# Patient Record
Sex: Male | Born: 1965 | Race: White | Hispanic: No | Marital: Married | Smoking: Former smoker
Health system: Southern US, Community
[De-identification: ages and names within clinical notes are randomized; demographics above are authoritative.]

## PROBLEM LIST (undated history)

## (undated) DIAGNOSIS — F419 Anxiety disorder, unspecified: Secondary | ICD-10-CM

## (undated) DIAGNOSIS — E785 Hyperlipidemia, unspecified: Secondary | ICD-10-CM

## (undated) DIAGNOSIS — T7840XA Allergy, unspecified, initial encounter: Secondary | ICD-10-CM

## (undated) DIAGNOSIS — I10 Essential (primary) hypertension: Secondary | ICD-10-CM

## (undated) DIAGNOSIS — M1990 Unspecified osteoarthritis, unspecified site: Secondary | ICD-10-CM

## (undated) DIAGNOSIS — J45909 Unspecified asthma, uncomplicated: Secondary | ICD-10-CM

## (undated) DIAGNOSIS — K219 Gastro-esophageal reflux disease without esophagitis: Secondary | ICD-10-CM

## (undated) HISTORY — PX: UPPER GASTROINTESTINAL ENDOSCOPY: SHX188

## (undated) HISTORY — PX: MOUTH SURGERY: SHX715

## (undated) HISTORY — PX: INGUINAL HERNIA REPAIR: SUR1180

## (undated) HISTORY — PX: OTHER SURGICAL HISTORY: SHX169

## (undated) HISTORY — DX: Gastro-esophageal reflux disease without esophagitis: K21.9

## (undated) HISTORY — DX: Unspecified asthma, uncomplicated: J45.909

## (undated) HISTORY — DX: Unspecified osteoarthritis, unspecified site: M19.90

## (undated) HISTORY — DX: Essential (primary) hypertension: I10

## (undated) HISTORY — PX: SHOULDER SURGERY: SHX246

## (undated) HISTORY — DX: Allergy, unspecified, initial encounter: T78.40XA

## (undated) HISTORY — DX: Anxiety disorder, unspecified: F41.9

## (undated) HISTORY — DX: Hyperlipidemia, unspecified: E78.5

---

## 2007-07-04 ENCOUNTER — Encounter: Admission: RE | Admit: 2007-07-04 | Discharge: 2007-07-04 | Payer: Self-pay | Admitting: Family Medicine

## 2011-01-12 ENCOUNTER — Encounter: Payer: Self-pay | Admitting: Neurological Surgery

## 2014-01-26 ENCOUNTER — Encounter: Payer: Self-pay | Admitting: Podiatry

## 2014-01-26 ENCOUNTER — Ambulatory Visit (INDEPENDENT_AMBULATORY_CARE_PROVIDER_SITE_OTHER): Payer: 59

## 2014-01-26 ENCOUNTER — Ambulatory Visit (INDEPENDENT_AMBULATORY_CARE_PROVIDER_SITE_OTHER): Payer: 59 | Admitting: Podiatry

## 2014-01-26 VITALS — BP 135/93 | HR 83 | Resp 16 | Ht 70.0 in | Wt 190.0 lb

## 2014-01-26 DIAGNOSIS — M722 Plantar fascial fibromatosis: Secondary | ICD-10-CM

## 2014-01-26 MED ORDER — TRIAMCINOLONE ACETONIDE 10 MG/ML IJ SUSP: 10.0000 mg | Freq: Once | INTRAMUSCULAR | Status: DC

## 2014-01-26 MED ORDER — DICLOFENAC SODIUM 75 MG PO TBEC: 75.0000 mg | DELAYED_RELEASE_TABLET | Freq: Two times a day (BID) | ORAL | Status: DC

## 2014-01-26 NOTE — Patient Instructions (Signed)

## 2014-01-26 NOTE — Progress Notes (Signed)
   Subjective:    Patient ID: Nathan Wu, male    DOB: Apr 21, 1966, 48 y.o.   MRN: [id number]  HPI Comments: "I have pain in the heels"  Patient states that he has been experiencing heel pain bilateral left over right since he bought new shoes 2 years ago. Has been getting worse since. AM pain. 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 Tried ace wraps, inserts, some Advil, stretching - temp relief.  Foot Pain      Review of Systems  All other systems reviewed and are negative.       Objective:   Physical Exam        Assessment & Plan:

## 2014-01-26 NOTE — Progress Notes (Signed)
Subjective:     Patient ID: Nathan Wu, male   DOB: 04-20-1966, 48 y.o.   MRN: [id number]  Foot Pain   patient states I have had heel pain in both feet for several years and it has worsened over the last 6 months. States that the pain has been worse than his left but both of them do get discomfort. Has tried over-the-counter insoles ice therapy and reduced activity at this time   Review of Systems  All other systems reviewed and are negative.       Objective:   Physical Exam  Nursing note and vitals reviewed. Constitutional: He is oriented to person, place, and time.  Cardiovascular: Intact distal pulses.   Musculoskeletal: Normal range of motion.  Neurological: He is oriented to person, place, and time.  Skin: Skin is warm.   neurovascular status intact with muscle strength adequate and no equinus condition noted. Patient is found to have pain in the plantar heel left over right with inflammation and fluid around the medial band with no other significant pathology    Assessment:     Plantar fasciitis heel region left over right chronic nature with acute flareup    Plan:     H&P and x-rays reviewed. Injected the plantar fascia of both feet 3 mg Kenalog 5 of Xylocaine Marcaine mixture and dispensed fascial brace is for each foot and placed on Voltaren 75 mg twice a day. Reappoint her recheck again in one week

## 2014-02-02 ENCOUNTER — Encounter: Payer: Self-pay | Admitting: Podiatry

## 2014-02-02 ENCOUNTER — Ambulatory Visit (INDEPENDENT_AMBULATORY_CARE_PROVIDER_SITE_OTHER): Payer: 59 | Admitting: Podiatry

## 2014-02-02 VITALS — BP 140/94 | HR 72 | Resp 12

## 2014-02-02 DIAGNOSIS — M722 Plantar fascial fibromatosis: Secondary | ICD-10-CM

## 2014-02-02 MED ORDER — TRIAMCINOLONE ACETONIDE 10 MG/ML IJ SUSP
10.0000 mg | Freq: Once | INTRAMUSCULAR | Status: AC
  Administered 2014-02-02: 10 mg

## 2014-02-02 NOTE — Progress Notes (Signed)
Subjective:     Patient ID: Nathan BreachJames Wu, male   DOB: 11-11-1966, 48 y.o.   MRN: [id number]  HPI patient states my heel is feeling some better but the left one continues to give me trouble over the right one. I am somewhat better than we were prior to working on this and I have been doing stretching exercise   Review of Systems     Objective:   Physical Exam Neurovascular status intact with patient well oriented x3 and discomfort still noted plantar heel left over right upon deep palpation    Assessment:     Plantar fasciitis improved but still present left over right    Plan:     Reviewed physical therapy and continued brace usage and reinjected the plantar fascial left 3 mg Kenalog 5 g arch Marcaine mixture and scanned for customized orthotic devices. Reappoint her recheck when return

## 2014-02-24 ENCOUNTER — Ambulatory Visit (INDEPENDENT_AMBULATORY_CARE_PROVIDER_SITE_OTHER): Payer: 59 | Admitting: *Deleted

## 2014-02-24 DIAGNOSIS — M722 Plantar fascial fibromatosis: Secondary | ICD-10-CM

## 2014-02-24 NOTE — Progress Notes (Signed)
   Subjective:    Patient ID: Nathan BreachJames Marken, male    DOB: 18-Nov-1966, 48 y.o.   MRN: [id number]  HPI  DISPENSED ORTHOTICS AND GIVEN INSTRUCTION.    Review of Systems     Objective:   Physical Exam        Assessment & Plan:

## 2014-02-24 NOTE — Patient Instructions (Signed)

## 2014-03-27 ENCOUNTER — Ambulatory Visit: Payer: 59 | Admitting: Podiatry

## 2014-04-05 ENCOUNTER — Ambulatory Visit: Payer: 59 | Admitting: Podiatry

## 2015-03-13 ENCOUNTER — Encounter: Payer: Self-pay | Admitting: Podiatry

## 2015-03-13 ENCOUNTER — Ambulatory Visit (INDEPENDENT_AMBULATORY_CARE_PROVIDER_SITE_OTHER): Payer: 59 | Admitting: Podiatry

## 2015-03-13 ENCOUNTER — Ambulatory Visit (INDEPENDENT_AMBULATORY_CARE_PROVIDER_SITE_OTHER): Payer: 59

## 2015-03-13 VITALS — BP 132/85 | HR 89 | Resp 18

## 2015-03-13 DIAGNOSIS — M779 Enthesopathy, unspecified: Secondary | ICD-10-CM

## 2015-03-13 DIAGNOSIS — M79672 Pain in left foot: Secondary | ICD-10-CM

## 2015-03-13 DIAGNOSIS — M722 Plantar fascial fibromatosis: Secondary | ICD-10-CM

## 2015-03-13 MED ORDER — TRIAMCINOLONE ACETONIDE 10 MG/ML IJ SUSP
10.0000 mg | Freq: Once | INTRAMUSCULAR | Status: AC
  Administered 2015-03-13: 10 mg

## 2015-03-14 NOTE — Progress Notes (Signed)
Subjective:     Patient ID: Nathan Wu, male   DOB: 1966-11-19, 49 y.o.   MRN: [id number]  HPI patient presents with pain near his ankle left and states that he also still has his heel pain but it is some improved from prior.   Review of Systems     Objective:   Physical Exam Neurovascular status intact muscle strength adequate with exquisite inflammation in the sinus tarsi left upon palpation    Assessment:     Sinus tarsitis left with probable change in gait process    Plan:     Injected the sinus tarsi left 3 mg Kenalog 5 mg Xylocaine and instructed on physical therapy and reappoint to recheck

## 2020-03-01 ENCOUNTER — Ambulatory Visit: Payer: 59 | Attending: Internal Medicine

## 2020-03-01 DIAGNOSIS — Z23 Encounter for immunization: Secondary | ICD-10-CM

## 2020-03-01 NOTE — Progress Notes (Signed)
   Covid-19 Vaccination Clinic  Name:  Nathan Wu    MRN: [id number] DOB: 04-02-1966  03/01/2020  Nathan Wu was observed post Covid-19 immunization for 15 minutes without incident. He was provided with Vaccine Information Sheet and instruction to access the V-Safe system.   Nathan Wu was instructed to call 911 with any severe reactions post vaccine: Marland Kitchen Difficulty breathing  . Swelling of face and throat  . A fast heartbeat  . A bad rash all over body  . Dizziness and weakness   Immunizations Administered    Name Date Dose VIS Date Route   Pfizer COVID-19 Vaccine 03/01/2020  2:34 PM 0.3 mL 12/02/2019 Intramuscular   Manufacturer: [employer], [employer]   Lot: [id number]   NDC: [address]-0

## 2020-03-27 ENCOUNTER — Ambulatory Visit: Payer: 59 | Attending: Internal Medicine

## 2020-03-27 DIAGNOSIS — Z23 Encounter for immunization: Secondary | ICD-10-CM

## 2020-03-27 NOTE — Progress Notes (Signed)
   Covid-19 Vaccination Clinic  Name:  Nathan Wu    MRN: [id number] DOB: 1966-04-14  03/27/2020  [name] was observed post Covid-19 immunization for 15 minutes without incident. He was provided with Vaccine Information Sheet and instruction to access the V-Safe system.   [name] was instructed to call 911 with any severe reactions post vaccine: Marland Kitchen Difficulty breathing  . Swelling of face and throat  . A fast heartbeat  . A bad rash all over body  . Dizziness and weakness   Immunizations Administered    Name Date Dose VIS Date Route   Pfizer COVID-19 Vaccine 03/27/2020  3:56 PM 0.3 mL 12/02/2019 Intramuscular   Manufacturer: [employer], [employer]   Lot: [id number]   NDC: [address]-7

## 2021-04-04 ENCOUNTER — Telehealth: Payer: Self-pay | Admitting: Internal Medicine

## 2021-04-04 NOTE — Telephone Encounter (Signed)
See late next week

## 2021-04-04 NOTE — Telephone Encounter (Signed)
scheduled

## 2021-04-04 NOTE — Telephone Encounter (Signed)
When I called patient to schedule a New Patient appointment he told me that he has been swollen behind his left knee for couple of weeks, he is now limping, he would like to be seen for this before the NP/CPE appointment that I scheduled in June. He is leaving in a couple hours and will not be back until Tuesday. I ask if if was discolored and he said it looked like maybe a blood vessel or something.

## 2021-04-11 ENCOUNTER — Other Ambulatory Visit: Payer: Self-pay

## 2021-04-11 ENCOUNTER — Ambulatory Visit (INDEPENDENT_AMBULATORY_CARE_PROVIDER_SITE_OTHER): Payer: 59 | Admitting: Internal Medicine

## 2021-04-11 ENCOUNTER — Encounter: Payer: Self-pay | Admitting: Internal Medicine

## 2021-04-11 VITALS — BP 130/100 | HR 90 | Ht 70.0 in | Wt 199.0 lb

## 2021-04-11 DIAGNOSIS — I10 Essential (primary) hypertension: Secondary | ICD-10-CM

## 2021-04-11 DIAGNOSIS — I8392 Asymptomatic varicose veins of left lower extremity: Secondary | ICD-10-CM

## 2021-04-11 DIAGNOSIS — M7122 Synovial cyst of popliteal space [Baker], left knee: Secondary | ICD-10-CM | POA: Diagnosis not present

## 2021-04-11 DIAGNOSIS — J3089 Other allergic rhinitis: Secondary | ICD-10-CM | POA: Diagnosis not present

## 2021-04-11 MED ORDER — LISINOPRIL 10 MG PO TABS: 10.0000 mg | ORAL_TABLET | Freq: Every day | ORAL | 0 refills | Status: DC

## 2021-04-11 MED ORDER — METHYLPREDNISOLONE ACETATE 80 MG/ML IJ SUSP
80.0000 mg | Freq: Once | INTRAMUSCULAR | Status: AC
  Administered 2021-04-11: 80 mg via INTRAMUSCULAR

## 2021-04-11 NOTE — Progress Notes (Signed)
   Subjective:    Patient ID: Nathan Wu, male    DOB: [birth year], 55 y.o.   MRN: [id number]  HPI 55 year old Male here for the first time today. Has been having issues with elevated BP.  He is concerned about this and did not want to wait until his health maintenance exam to have this evaluated.  Also, he is concerned about some swelling in the popliteal area.  He has health maintenance exam upcoming in the near future around June 21.  Took Lisinopril 5 or 10 mg daily for hypertension several years.  FHx: Father with hx of HTN, carotid endarertectomy. Mother deceased with hx of melanoma- started in eye and metastasized.  He has some chronic postnasal drip and likely would benefit from Flonase nasal spray.  Review of Systems to be reviewed at time of health maintenance exam in June     Objective:   Physical Exam Blood pressure is elevated at 130/100 BMI 28.55 pulse 90 and regular pulse oximetry 97% weight 199 pounds height 5 feet 10 inches  Skin is warm and dry.  No cervical adenopathy.  Chest is clear to auscultation.  Cardiac exam regular rate and rhythm.  Has boggy nasal mucosa consistent with allergic rhinitis.  Neck is supple.  No carotid bruits.  No JVD.  He has a popliteal swelling behind left knee which is consistent with a benign popliteal cyst.       Assessment & Plan:  History of essential hypertension and likely continues to have this although he currently is not on medication.  We are going to restart lisinopril 10 mg daily and follow-up with him in June at time of health maintenance exam.  He may try Flonase nasal spray for nasal congestion and postnasal drip.  Benign left popliteal cyst -reassured there was no clot there

## 2021-05-19 NOTE — Patient Instructions (Signed)
Keep appointment June 21 for health maintenance exam.  We have started you on lisinopril 10 mg daily for elevated blood pressure.  May try Flonase nasal spray for nasal congestion and postnasal drip.  You have a likely benign popliteal cyst left posterior leg near the knee which is not a clot.

## 2021-05-21 ENCOUNTER — Telehealth: Payer: Self-pay

## 2021-05-21 NOTE — Telephone Encounter (Signed)
We need more information

## 2021-05-21 NOTE — Telephone Encounter (Signed)
Called patient back to let him know since he was at [facility] he would need to go to Urgent Care to be treated, he verbalized understanding

## 2021-05-21 NOTE — Telephone Encounter (Addendum)
Called patient back he is at [facility] was going to head home this coming weekend, however since he has COVID, he is not well enough to travel. Has had COVID vaccine and 1 booster. He is coughing up a little clear stuff, however unable to rest from coughing so much and his chest is hurting from the coughing.

## 2021-05-21 NOTE — Telephone Encounter (Signed)
Advise Urgent Care visit there.

## 2021-05-21 NOTE — Telephone Encounter (Signed)
Patient called symptoms began 3 days ago, test for COVID today was positive. Has a fever 100.6, cough, runny nose. No appetite and is fatigued. He is out of town and would like to get some advise. He has a PCR test scheduled tomorrow at 11:45am.   [phone]

## 2021-06-06 ENCOUNTER — Other Ambulatory Visit (INDEPENDENT_AMBULATORY_CARE_PROVIDER_SITE_OTHER): Payer: 59 | Admitting: Internal Medicine

## 2021-06-06 ENCOUNTER — Other Ambulatory Visit: Payer: Self-pay

## 2021-06-06 DIAGNOSIS — Z0000 Encounter for general adult medical examination without abnormal findings: Secondary | ICD-10-CM

## 2021-06-06 DIAGNOSIS — Z125 Encounter for screening for malignant neoplasm of prostate: Secondary | ICD-10-CM

## 2021-06-06 DIAGNOSIS — I10 Essential (primary) hypertension: Secondary | ICD-10-CM

## 2021-06-06 DIAGNOSIS — Z13220 Encounter for screening for lipoid disorders: Secondary | ICD-10-CM

## 2021-06-06 LAB — CBC WITH DIFFERENTIAL/PLATELET
Absolute Monocytes: 328 cells/uL (ref 200–950)
Basophils Absolute: 20 cells/uL (ref 0–200)
Basophils Relative: 0.4 %
Eosinophils Absolute: 167 cells/uL (ref 15–500)
Eosinophils Relative: 3.4 %
HCT: 42.4 % (ref 38.5–50.0)
Hemoglobin: 14 g/dL (ref 13.2–17.1)
Lymphs Abs: 1313 cells/uL (ref 850–3900)
MCH: 27.6 pg (ref 27.0–33.0)
MCHC: 33 g/dL (ref 32.0–36.0)
MCV: 83.6 fL (ref 80.0–100.0)
MPV: 11.5 fL (ref 7.5–12.5)
Monocytes Relative: 6.7 %
Neutro Abs: 3072 cells/uL (ref 1500–7800)
Neutrophils Relative %: 62.7 %
Platelets: 209 10*3/uL (ref 140–400)
RBC: 5.07 10*6/uL (ref 4.20–5.80)
RDW: 13 % (ref 11.0–15.0)
Total Lymphocyte: 26.8 %
WBC: 4.9 10*3/uL (ref 3.8–10.8)

## 2021-06-06 LAB — COMPLETE METABOLIC PANEL WITHOUT GFR
AG Ratio: 1.8 (calc) (ref 1.0–2.5)
ALT: 28 U/L (ref 9–46)
AST: 20 U/L (ref 10–35)
Albumin: 4.5 g/dL (ref 3.6–5.1)
Alkaline phosphatase (APISO): 61 U/L (ref 35–144)
BUN: 10 mg/dL (ref 7–25)
CO2: 28 mmol/L (ref 20–32)
Calcium: 9.4 mg/dL (ref 8.6–10.3)
Chloride: 101 mmol/L (ref 98–110)
Creat: 0.89 mg/dL (ref 0.70–1.33)
GFR, Est African American: 112 mL/min/{1.73_m2}
GFR, Est Non African American: 97 mL/min/{1.73_m2}
Globulin: 2.5 g/dL (ref 1.9–3.7)
Glucose, Bld: 88 mg/dL (ref 65–99)
Potassium: 4.2 mmol/L (ref 3.5–5.3)
Sodium: 136 mmol/L (ref 135–146)
Total Bilirubin: 0.6 mg/dL (ref 0.2–1.2)
Total Protein: 7 g/dL (ref 6.1–8.1)

## 2021-06-06 LAB — LIPID PANEL
Cholesterol: 247 mg/dL — ABNORMAL HIGH
HDL: 49 mg/dL
LDL Cholesterol (Calc): 159 mg/dL — ABNORMAL HIGH
Non-HDL Cholesterol (Calc): 198 mg/dL — ABNORMAL HIGH
Total CHOL/HDL Ratio: 5 (calc) — ABNORMAL HIGH
Triglycerides: 230 mg/dL — ABNORMAL HIGH

## 2021-06-06 LAB — PSA: PSA: 0.68 ng/mL (ref <=4.00)

## 2021-06-11 ENCOUNTER — Encounter: Payer: Self-pay | Admitting: Internal Medicine

## 2021-06-11 ENCOUNTER — Other Ambulatory Visit: Payer: Self-pay

## 2021-06-11 ENCOUNTER — Ambulatory Visit (INDEPENDENT_AMBULATORY_CARE_PROVIDER_SITE_OTHER): Payer: 59 | Admitting: Internal Medicine

## 2021-06-11 VITALS — BP 120/90 | Ht 70.0 in | Wt 187.0 lb

## 2021-06-11 DIAGNOSIS — I10 Essential (primary) hypertension: Secondary | ICD-10-CM | POA: Diagnosis not present

## 2021-06-11 DIAGNOSIS — E782 Mixed hyperlipidemia: Secondary | ICD-10-CM | POA: Diagnosis not present

## 2021-06-11 DIAGNOSIS — Z8659 Personal history of other mental and behavioral disorders: Secondary | ICD-10-CM | POA: Diagnosis not present

## 2021-06-11 DIAGNOSIS — J3089 Other allergic rhinitis: Secondary | ICD-10-CM

## 2021-06-11 DIAGNOSIS — Z8616 Personal history of COVID-19: Secondary | ICD-10-CM

## 2021-06-11 DIAGNOSIS — Z0000 Encounter for general adult medical examination without abnormal findings: Secondary | ICD-10-CM | POA: Diagnosis not present

## 2021-06-11 LAB — POCT URINALYSIS DIPSTICK
Appearance: NEGATIVE
Bilirubin, UA: NEGATIVE
Blood, UA: NEGATIVE
Glucose, UA: NEGATIVE
Ketones, UA: NEGATIVE
Leukocytes, UA: NEGATIVE
Nitrite, UA: NEGATIVE
Odor: NEGATIVE
Protein, UA: NEGATIVE
Spec Grav, UA: 1.015 (ref 1.010–1.025)
Urobilinogen, UA: 0.2 E.U./dL
pH, UA: 6.5 (ref 5.0–8.0)

## 2021-06-11 NOTE — Patient Instructions (Addendum)
Begin Crestor 20 mg daily and will follow up in 3 months. Colonoscopy referral.  It was a pleasure to see you today.

## 2021-06-11 NOTE — Progress Notes (Addendum)
   Subjective:    Patient ID: Nathan Wu, male    DOB: 11/26/1966, 55 y.o.   MRN: [id number]  HPI 55 year old Male presents for health maintenance exam.  He was seen initially for the first time April 21 regarding elevated blood pressure.  Previously had taken lisinopril for high blood pressure.  At last visit started on lisinopril 10 mg daily and tolerates it well.  We received a telephone call from him on May 31 indicating that he had COVID-19.  He was at [facility] and apparently became ill there.  Urgent care visit advised.  Past medical history: Fractured right wrist around 1977  History of hernia repair around 1975  History of shoulder surgery 2015  History of vitreous floater bilaterally seen ophthalmologist Dr. Sondra Barges at [facility] ophthalmology in [facility].  History of plantar fasciitis and has seen podiatrist in the past  Had Tdap in 2020.  Has had 3 COVID vaccines.  The last one was November 2021.  Had lentigo removed from right neck and left rib in 2005  History of chronic postnasal drip and at last visit which was his first visit April 11, 2021 Flonase nasal spray was recommended.  Depo-Medrol 80 mg IM given in April for allergy symptoms.  History of plantar fasciitis seen by Dr. Charlsie Merles in 2015.  Family history: Father with history of hypertension and carotid endarterectomy.  Mother deceased with history of melanoma-disease started in her and metastasized.  3 brothers 1 died at age 72 of undetermined causes.  1 brother age 65 in good health.  1 brother age 55 with hypertension.  Social history: He is married.  He works as an Lexicographer.  He is employed by [employer] Former smoker.  Social alcohol consumption consisting of beer and mixed drinks.  No children.  Wife is retired.  He enjoys golf and fishing.  Quit smoking 15 years ago.  Referred by Donald Pore.         Review of Systems generally feels well and has  no new complaints.   No concerns after having COVID-19.     Objective:   Physical Exam Blood pressure 120/90 weight 187 pounds height 5 feet 10 inches  Skin: Warm and dry.  No cervical adenopathy.  No thyromegaly.  No carotid bruits.  Chest is clear. Cardiac exam: Regular rate and rhythm without ectopy.  Abdomen is soft nondistended, without organomegaly or masses. Prostate normal. No LE edema. Neuro: No focal deficits. Affect, thought, and judgement normal.      Assessment & Plan:   Essential hypertension currently stable and will continue to monitor on Lisinopril 10 mg daily  Needs referral for colonoscopy  Significant hyperlipidemia - mixed. Start Crestor 20 mg daily and RTC in 3 months for follow up.  Hx of Covid-19  History of anxiety- Rx for Xanax to take sparingly if needed.  Plan: Continue Lisinopril and start Crestor. Follow up in 3 months. Covid booster suggested in 2 months. Tetanus vaccine is up to date.

## 2021-06-12 MED ORDER — ROSUVASTATIN CALCIUM 20 MG PO TABS: 20.0000 mg | ORAL_TABLET | Freq: Every day | ORAL | 3 refills | Status: DC

## 2021-06-12 MED ORDER — LISINOPRIL 10 MG PO TABS: 10.0000 mg | ORAL_TABLET | Freq: Every day | ORAL | 1 refills | Status: DC

## 2021-06-12 MED ORDER — ALPRAZOLAM 0.5 MG PO TABS: 0.5000 mg | ORAL_TABLET | Freq: Two times a day (BID) | ORAL | 0 refills | Status: DC | PRN

## 2021-07-18 ENCOUNTER — Ambulatory Visit (INDEPENDENT_AMBULATORY_CARE_PROVIDER_SITE_OTHER): Payer: 59 | Admitting: Internal Medicine

## 2021-07-18 ENCOUNTER — Other Ambulatory Visit: Payer: Self-pay

## 2021-07-18 ENCOUNTER — Encounter: Payer: Self-pay | Admitting: Internal Medicine

## 2021-07-18 VITALS — BP 140/90 | HR 102 | Temp 98.0°F | Ht 70.0 in | Wt 187.0 lb

## 2021-07-18 DIAGNOSIS — E782 Mixed hyperlipidemia: Secondary | ICD-10-CM

## 2021-07-18 DIAGNOSIS — I10 Essential (primary) hypertension: Secondary | ICD-10-CM | POA: Diagnosis not present

## 2021-07-18 DIAGNOSIS — J22 Unspecified acute lower respiratory infection: Secondary | ICD-10-CM

## 2021-07-18 MED ORDER — AZITHROMYCIN 250 MG PO TABS: ORAL_TABLET | ORAL | 0 refills | Status: AC

## 2021-07-18 MED ORDER — METHYLPREDNISOLONE ACETATE 80 MG/ML IJ SUSP
80.0000 mg | Freq: Once | INTRAMUSCULAR | Status: AC
  Administered 2021-07-18: 80 mg via INTRAMUSCULAR

## 2021-07-18 MED ORDER — HYDROCODONE BIT-HOMATROP MBR 5-1.5 MG/5ML PO SOLN: 5.0000 mL | Freq: Three times a day (TID) | ORAL | 0 refills | Status: DC | PRN

## 2021-07-18 NOTE — Progress Notes (Addendum)
   Subjective:    Patient ID: Nathan Wu, male    DOB: 04-May-1966, 55 y.o.   MRN: [id number]  HPI 55 year old Male with 2-3  week history of cough that started around the time he had a COVID exposure at work.  He is already had COVID-19 once around May 31.  Records indicate he has had 3 COVID immunizations the last 22 October 2020.  He has not been boosted.  It was after having COVID for him to get his booster this summer.  He denies shortness breath and he is tired of coughing.  Cough is productive.   He had health maintenance exam and established here in June 2022.  He was started on Crestor 20 mg daily at that time.  He has a history of hypertension.    Review of Systems no dysgeusia, shortness of breath, myalgias, documented fever or chills     Objective:   Physical Exam Blood pressure 140/90, pulse 102, he seems slightly anxious temperature 98 degrees pulse oximetry 97% weight 187 pounds BMI 26.83  Skin: Warm and dry.  No cervical adenopathy.  TMs are clear.  Neck is supple.  Chest is clear to auscultation but he has a deep congested cough       Assessment & Plan:   Acute lower respiratory infection  Plan: Depo-Medrol 80 mg IM which should help with inflammation and cough.  Zithromax Z-PAK 2 tabs day 1 followed by 1 tab days 2 through 5.  He will take Hycodan 1 teaspoon every 8 hours as needed for cough.  Rest and drink plenty of fluids.  I have obtained respiratory virus panel and COVID-19 PCR test today.  Addendum Covid-19 PCR test is positive. Patient informed. Out of work x 5 days from yesterday.

## 2021-07-18 NOTE — Patient Instructions (Addendum)
Rest and drink plenty of fluids.  COVID-19 PCR respiratory virus panel pending.  Take Zithromax Z-PAK 2 tabs day 1 followed by 1 tab days 2 through 5.  Hycodan sparingly 1 teaspoon every 8 hours as needed for cough.  Call if not improving in 7 to 10 days or sooner if worse.  Depo-Medrol 80 mg IM given in office

## 2021-07-19 LAB — SARS-COV-2 RNA,(COVID-19) QUALITATIVE NAAT: SARS CoV2 RNA: DETECTED — AB

## 2021-07-22 ENCOUNTER — Telehealth: Payer: Self-pay | Admitting: Internal Medicine

## 2021-07-22 NOTE — Telephone Encounter (Signed)
Faxed Positive PCR COVID results to [facility] [phone], phone [phone]  Positive as of 07/18/2021

## 2021-09-09 ENCOUNTER — Other Ambulatory Visit (INDEPENDENT_AMBULATORY_CARE_PROVIDER_SITE_OTHER): Payer: 59 | Admitting: Internal Medicine

## 2021-09-09 ENCOUNTER — Other Ambulatory Visit: Payer: Self-pay

## 2021-09-09 DIAGNOSIS — Z5181 Encounter for therapeutic drug level monitoring: Secondary | ICD-10-CM

## 2021-09-09 DIAGNOSIS — I10 Essential (primary) hypertension: Secondary | ICD-10-CM

## 2021-09-10 LAB — HEPATIC FUNCTION PANEL
AG Ratio: 2 (calc) (ref 1.0–2.5)
ALT: 36 U/L (ref 9–46)
AST: 28 U/L (ref 10–35)
Albumin: 4.8 g/dL (ref 3.6–5.1)
Alkaline phosphatase (APISO): 68 U/L (ref 35–144)
Bilirubin, Direct: 0.1 mg/dL (ref 0.0–0.2)
Globulin: 2.4 g/dL (calc) (ref 1.9–3.7)
Indirect Bilirubin: 0.5 mg/dL (calc) (ref 0.2–1.2)
Total Bilirubin: 0.6 mg/dL (ref 0.2–1.2)
Total Protein: 7.2 g/dL (ref 6.1–8.1)

## 2021-09-10 LAB — LIPID PANEL
Cholesterol: 170 mg/dL (ref <200)
HDL: 68 mg/dL (ref >=40)
LDL Cholesterol (Calc): 78 mg/dL (calc)
Non-HDL Cholesterol (Calc): 102 mg/dL (calc) (ref <130)
Total CHOL/HDL Ratio: 2.5 (calc) (ref <5.0)
Triglycerides: 137 mg/dL (ref <150)

## 2021-09-12 ENCOUNTER — Encounter: Payer: Self-pay | Admitting: Internal Medicine

## 2021-09-12 ENCOUNTER — Ambulatory Visit (INDEPENDENT_AMBULATORY_CARE_PROVIDER_SITE_OTHER): Payer: 59 | Admitting: Internal Medicine

## 2021-09-12 ENCOUNTER — Other Ambulatory Visit: Payer: Self-pay

## 2021-09-12 VITALS — BP 120/90 | HR 60 | Resp 18 | Ht 70.0 in | Wt 196.0 lb

## 2021-09-12 DIAGNOSIS — F5102 Adjustment insomnia: Secondary | ICD-10-CM | POA: Diagnosis not present

## 2021-09-12 DIAGNOSIS — Z8659 Personal history of other mental and behavioral disorders: Secondary | ICD-10-CM | POA: Diagnosis not present

## 2021-09-12 DIAGNOSIS — E782 Mixed hyperlipidemia: Secondary | ICD-10-CM | POA: Diagnosis not present

## 2021-09-12 MED ORDER — ALPRAZOLAM 0.5 MG PO TABS: 0.5000 mg | ORAL_TABLET | Freq: Two times a day (BID) | ORAL | 2 refills | Status: DC | PRN

## 2021-09-12 NOTE — Progress Notes (Signed)
   Subjective:    Patient ID: Nathan Wu, male    DOB: Jun 26, 1966, 55 y.o.   MRN: [id number]  HPI 55 year old Male seen today for follow up on hyperlipidemia. Has been taking generic Crestor 20 mg daily since June.  In June, total cholesterol was 247 and triglycerides were 230.  LDL was 159.  Now on Crestor 20 mg daily his lipid panel is completely normal.  LDL is 78, triglycerides 137, HDL 68 increased from 49 when checked 3 months ago and total cholesterol has improved from  247 to 170. This is and excellent response.  Not a lot of exercise.  A lot of stress at work.  Longstanding history of insomnia.  Says his father has insomnia and he thinks it may be inherited.  However there is work stress.  Asking about sleep medication.  Sometimes during the day when there is work stress he will take Xanax 0.5 mg on an as-needed basis.  I suggested he try Xanax 0.5 mg 1 hour before going to bed.  When he gets in bed his mind gets going and cannot fall asleep.  This may have an element of anxiety and work stress is contributing to that.  His wife has retired.  He is not ready to retire yet.  Review of Systems  tried Effexor for sleep and work stress but had issues with elevated BP and he stopped taking it.     Objective:   Physical Exam Blood pressure today is 120/90 pulse 60 regular respiratory rate 18 pulse oximetry 98% weight 196 pounds BMI 28.12  His affect is slightly anxious when speaking about his sleep issues.     Assessment & Plan:  I think his insomnia has an element of anxiety and work stress is contributing to that.  He may try Xanax 0.5 mg up to twice daily as needed for insomnia and anxiety.  Hyperlipidemia-markedly improved and now normal lipid panel with Crestor 20 mg daily.  He will continue with same dose.  Health maintenance: Recommend COVID booster.  Needs to have flu vaccine.

## 2021-09-12 NOTE — Patient Instructions (Addendum)
Try Xanax 0.5 mg up to twice daily if needed for insomnia/anxiety.  Lipid panel is excellent on Crestor 20 mg daily.  Follow-up in June for health maintenance exam and fasting labs.  Please have COVID booster this Fall.  Recommend flu vaccine.  Watch diet and try to get some exercise.

## 2021-12-05 ENCOUNTER — Other Ambulatory Visit: Payer: Self-pay | Admitting: Internal Medicine

## 2021-12-31 ENCOUNTER — Telehealth: Payer: Self-pay

## 2021-12-31 NOTE — Telephone Encounter (Signed)
Patient is going to do COVID test this afternoon when he gets off work and he also wants to be sen for bot things at the same time later in the week.

## 2021-12-31 NOTE — Telephone Encounter (Signed)
Patient states that he has had congestion x couple months. He states that it seems to be stuck in his throat upper chest. He also states that he is having some numbness in his legs from his lower back. He has been advised to take a covid test and let us know results. He would like an appointment.

## 2022-01-01 NOTE — Telephone Encounter (Signed)
LVM to CB with COVID test results and to schedule appointment

## 2022-01-02 NOTE — Telephone Encounter (Signed)
Scheduled 30 minute appt

## 2022-01-03 ENCOUNTER — Ambulatory Visit (INDEPENDENT_AMBULATORY_CARE_PROVIDER_SITE_OTHER): Payer: 59 | Admitting: Internal Medicine

## 2022-01-03 ENCOUNTER — Encounter: Payer: Self-pay | Admitting: Internal Medicine

## 2022-01-03 ENCOUNTER — Ambulatory Visit
Admission: RE | Admit: 2022-01-03 | Discharge: 2022-01-03 | Disposition: A | Discharge status: Home / Self Care | Payer: 59 | Source: Ambulatory Visit | Attending: Internal Medicine | Admitting: Internal Medicine

## 2022-01-03 ENCOUNTER — Other Ambulatory Visit: Payer: Self-pay

## 2022-01-03 VITALS — BP 138/84 | HR 97 | Temp 98.6°F

## 2022-01-03 DIAGNOSIS — G5712 Meralgia paresthetica, left lower limb: Secondary | ICD-10-CM

## 2022-01-03 DIAGNOSIS — Z8616 Personal history of COVID-19: Secondary | ICD-10-CM | POA: Diagnosis not present

## 2022-01-03 DIAGNOSIS — R062 Wheezing: Secondary | ICD-10-CM

## 2022-01-03 MED ORDER — METHYLPREDNISOLONE 4 MG PO TBPK: ORAL_TABLET | ORAL | 0 refills | Status: DC

## 2022-01-03 MED ORDER — DOXYCYCLINE HYCLATE 100 MG PO TABS: 100.0000 mg | ORAL_TABLET | Freq: Two times a day (BID) | ORAL | 0 refills | Status: DC

## 2022-01-03 NOTE — Patient Instructions (Addendum)
Continue Flonase. Try course of Prednisone and Doxycycline.  If symptoms persist, refer to allergist.  Patient reassured about neuralgia paresthetica.  No need to treat this at the present time.  Coronary calcium score CT ordered at [facility].  History of insomnia.  He is on statin medication.  Has chronic postnasal drip.  Flonase nasal spray recommended at prior visit.  1 brother died at age 56 of undetermined cause.  1 brother died at age 73 of undetermined cause problems.

## 2022-01-03 NOTE — Progress Notes (Signed)
° °  Subjective:    Patient ID: Nathan Wu, male    DOB: 02-Jun-1966, 56 y.o.   MRN: [id number]  HPI Patient had Covid in July. Since then he had had persistent respiratory congestion. At the time, he was seen and treated with Depomedrol and Zithromax.  He has a recording of him sleeping and he has apparent upper airway congestion on audio.  He says he had sleep apnea ruled out some 10 years ago.  He is also interested in coronary calcium screening.  Another issue is some numbness along his left anterior lateral leg from hip to knee.  He has no weakness in the leg and has no injury to the left upper leg.  He has mixed hyperlipidemia and is on statin medication.  Does not get a lot of exercise.  Has stressful job.  History of postnasal drip for which Flonase nasal spray has been suggested in Spring 2022. May need formal Allergy testing and consultation if symptoms persist.   Father with history of hypertension and carotid endarterectomy.  1 brother died at age 66 of undetermined causes  Review of Systems denies fever, discolored sputum.  Still has some sputum when he coughs but it is not discolored.     Objective:   Physical Exam Sensation is intact in the left lower extremity and he has full range of motion in the left lower extremity and normal muscle strength.  His chest is clear.  No rales or wheezing are appreciated.  TMs and pharynx are clear.  Vital signs reviewed.  Blood pressure 138/84.  Pulse oximetry 98% on room air.       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  He has full strength in his lower extremity and sensation is intact.  If symptoms persist and are bothersome, he would be a candidate for perhaps Lyrica or Neurontin.  Protracted cough and congestion particularly with sleeping after having  COVID in July.  He will have a chest x-ray in the near future.  He will take a tapering course of prednisone and take doxycycline 100 mg twice daily for 10 days.  If symptoms persist, he will be referred to allergist for allergy testing.

## 2022-01-15 ENCOUNTER — Ambulatory Visit (HOSPITAL_BASED_OUTPATIENT_CLINIC_OR_DEPARTMENT_OTHER)
Admission: RE | Admit: 2022-01-15 | Discharge: 2022-01-15 | Disposition: A | Discharge status: Home / Self Care | Payer: 59 | Source: Ambulatory Visit | Attending: Internal Medicine | Admitting: Internal Medicine

## 2022-01-15 ENCOUNTER — Other Ambulatory Visit: Payer: Self-pay

## 2022-01-15 DIAGNOSIS — Z136 Encounter for screening for cardiovascular disorders: Secondary | ICD-10-CM | POA: Insufficient documentation

## 2022-03-03 ENCOUNTER — Other Ambulatory Visit: Payer: Self-pay | Admitting: Internal Medicine

## 2022-04-01 ENCOUNTER — Telehealth: Payer: Self-pay

## 2022-04-01 NOTE — Telephone Encounter (Signed)
Patient states that he has wheezing again and was told her could get referred to allergist if it came back.  ?

## 2022-04-02 ENCOUNTER — Other Ambulatory Visit: Payer: Self-pay

## 2022-04-02 DIAGNOSIS — R062 Wheezing: Secondary | ICD-10-CM

## 2022-04-02 DIAGNOSIS — J3089 Other allergic rhinitis: Secondary | ICD-10-CM

## 2022-04-02 NOTE — Telephone Encounter (Signed)
He is ok now just looking for referral. Referral sent.  ?

## 2022-04-08 ENCOUNTER — Ambulatory Visit (INDEPENDENT_AMBULATORY_CARE_PROVIDER_SITE_OTHER): Payer: 59 | Admitting: Allergy & Immunology

## 2022-04-08 ENCOUNTER — Encounter: Payer: Self-pay | Admitting: Allergy & Immunology

## 2022-04-08 VITALS — BP 136/88 | HR 109 | Temp 98.1°F | Resp 18 | Ht 70.0 in | Wt 197.1 lb

## 2022-04-08 DIAGNOSIS — J4540 Moderate persistent asthma, uncomplicated: Secondary | ICD-10-CM | POA: Insufficient documentation

## 2022-04-08 DIAGNOSIS — J301 Allergic rhinitis due to pollen: Secondary | ICD-10-CM | POA: Diagnosis not present

## 2022-04-08 DIAGNOSIS — K219 Gastro-esophageal reflux disease without esophagitis: Secondary | ICD-10-CM

## 2022-04-08 MED ORDER — TRELEGY ELLIPTA 200-62.5-25 MCG/ACT IN AEPB: 1.0000 | INHALATION_SPRAY | Freq: Every day | RESPIRATORY_TRACT | 5 refills | Status: AC

## 2022-04-08 MED ORDER — ALBUTEROL SULFATE HFA 108 (90 BASE) MCG/ACT IN AERS: 2.0000 | INHALATION_SPRAY | Freq: Four times a day (QID) | RESPIRATORY_TRACT | 2 refills | Status: DC | PRN

## 2022-04-08 NOTE — Progress Notes (Signed)
? ?NEW PATIENT ? ?Date of Service/Encounter:  04/08/22 ? ?Consult requested by: Margaree Mackintosh, MD ? ? ?Assessment:  ? ?Moderate persistent asthma, uncomplicated - with improvement with bronchodilator treatment ? ?Seasonal allergic rhinitis due to pollen ? ?GERD - likely contributing to his wheezing at night ? ?Preference for not taking medications routinely ? ?Plan/Recommendations:  ? ? ?1. Moderate persistent asthma, uncomplicated ?- Lung testing was in the 70% range, but this improved with the albuterol puffs. ?- This points towards a diagnosis of asthma, but does not necessarily rule this in.  ?- We are starting you on a medication that contains THREE medications to help with breathing (an inhaled steroid to help with inflammation in the lungs, a long acting form of albuterol, and a third medication to help your muscles in your lungs loosen up).  ?- Daily controller medication(s): Trelegy 200/62.5/25 one puff once daily ?- Prior to physical activity: albuterol 2 puffs 10-15 minutes before physical activity. ?- Rescue medications: albuterol 4 puffs every 4-6 hours as needed ?- Asthma control goals:  ?* Full participation in all desired activities (may need albuterol before activity) ?* Albuterol use two time or less a week on average (not counting use with activity) ?* Cough interfering with sleep two time or less a month ?* Oral steroids no more than once a year ?* No hospitalizations ? ?2. Seasonal allergic rhinitis due to pollen ?- Continue with your antihistamine as needed.  ?- We are not going to test for allergies today. ?- We can always test in the future if we need to do so. ? ?3. GERD ?- If starting the new inhaler does not fully resolve your symptoms., I would strongly encourage you to take omeprazole EVERY day in case you are experiencing some silent reflux at night that is contributing to your breathing and wheezing problems. ? ?4. Return in about 2 months (around 06/08/2022).  ? ? ?This note in its  entirety was forwarded to the Provider who requested this consultation. ? ?Subjective:  ? ?Norah Fick is a 56 y.o. male presenting today for evaluation of  ?Chief Complaint  ?Patient presents with  ? Establish Care  ? Wheezing  ?  Noticed beginning of last year--some coughing not much--worse at night--started before he had covid (he had covid 2 times)  ? ? ?Nicklos Gaxiola Smalling has a history of the following: ?Patient Active Problem List  ? Diagnosis Date Noted  ? Moderate persistent asthma, uncomplicated 04/08/2022  ? Seasonal allergic rhinitis due to pollen 04/08/2022  ? ? ?History obtained from: chart review and patient. ? ?Festus Pursel Beckstrom was referred by Margaree Mackintosh, MD.    ? ?Hermann is a 56 y.o. male presenting for an evaluation of wheezing . ?  ?Asthma/Respiratory Symptom History: He reports that the his wheezing "drives [him] nuts". Two years ago in January 2021, he started having wheezing. He has never seen an allergist and his PCP recommended that.  He reports that he is wheezing all day long. He did get a steroid shot that wiped everything out. This came back later over the course of a week or two. He did not try any inhalers or nebulizer treatments. Triggers include mowing grass and being outdoors. Wheezing is constant throughout the year. He did not have problems when he was smoking but he quit in 2002. His wife sleeps on the couch most of the time between wheezing snoring. He has never seen a Pulmonologist. He has not really changed his  activity level because of the symptoms. It does get worse when he lays down. He has some wheezing on the exhalation. He has never bene hospitalized for the wheezing. He estimates that he has gotten a steroid shot twice in total. He did get a 4-5 day taper most recently.  ? ?Allergic Rhinitis Symptom History: He has allergies to grass and such.  He is on cetirizine for this which seems to work well to control his allergy symptoms.  ? 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 He is around a lot of dust and exposures.  ? ?Skin Symptom History: He does have some redness on his face during the summer time. He never had diagnosed. He just puts some prescription of some sort.  ? ?GERD Symptom History: He has reflux but does not take anything daily for his symptoms. He will take omeprazole fairly rarely. He estimates that he will use it for 2-3 days and then stop.  ? ?Otherwise, there is no history of other atopic diseases, including drug allergies, stinging insect allergies, urticaria, or contact dermatitis. There is no significant infectious history. Vaccinations are up to date.  ? ? ?Past Medical History: ?Patient Active Problem List  ? Diagnosis Date Noted  ? Moderate persistent asthma, uncomplicated 04/08/2022  ? Seasonal allergic rhinitis due to pollen 04/08/2022  ? ? ?Medication List:  ?Allergies as of 04/08/2022   ?No Known Allergies ?  ? ?  ?Medication List  ?  ? ?  ? Accurate as of April 08, 2022  2:53 PM. If you have any questions, ask your nurse or doctor.  ?  ?  ? ?  ? ?STOP taking these medications   ? ?doxycycline 100 MG tablet ?Commonly known as: VIBRA-TABS ?Stopped by: Alfonse SpruceJoel Louis Mayela Bullard, MD ?  ?HYDROcodone bit-homatropine 5-1.5 MG/5ML syrup ?Commonly known as: HYCODAN ?Stopped by: Alfonse SpruceJoel Louis Nijah Orlich, MD ?  ?methylPREDNISolone 4 MG Tbpk tablet ?Commonly known as: MEDROL DOSEPAK ?Stopped by: Alfonse SpruceJoel Louis Deakon Frix, MD ?  ? ?  ? ?TAKE these medications   ? ?ALPRAZolam 0.5 MG tablet ?Commonly known as: Prudy FeelerXANAX ?TAKE 1 TABLET BY MOUTH 2 TIMES DAILY AS NEEDED FOR ANXIETY. ?  ?lisinopril 10 MG tablet ?Commonly known as: ZESTRIL ?TAKE 1 TABLET BY MOUTH EVERY DAY ?  ?rosuvastatin 20 MG tablet ?Commonly known as: Crestor ?Take 1 tablet (20 mg total) by mouth daily. ?  ? ?  ? ? ?Birth History: non-contributory ? ?Developmental History: non-contributory ? ?Past Surgical History: ?History reviewed. No pertinent surgical  history. ? ? ?Family History: ?Family History  ?Problem Relation Age of Onset  ? Melanoma Mother   ? Heart attack Father   ? Prostate cancer Father   ? Hypertension Father   ? Hypertension Brother   ? Obesity Brother   ? ? ? ?Social History: Fayrene FearingJames lives at home with his wife.  He lives in a house that is 56 years old.  There is carpeting throughout the home.  They have a plan for heating and cooling.  There are 2 dogs inside of the home.  There are no dust mite covers on the bedding.  There is no tobacco exposure.  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  He is exposed to fumes, chemicals, and dust in his work.  [place] or [place].  They do not use a HEPA filter.  He was a smoker for 14  years.  He quit in 2002 and smoked 1 to 2 packs/day. ? ? ?Review of Systems  ?Constitutional: Negative.  Negative for fever, malaise/fatigue and weight loss.  ?HENT: Negative.  Negative for congestion, ear discharge and ear pain.   ?Eyes:  Negative for pain, discharge and redness.  ?Respiratory:  Positive for shortness of breath and wheezing. Negative for cough and sputum production.   ?Cardiovascular: Negative.  Negative for chest pain and palpitations.  ?Gastrointestinal:  Negative for abdominal pain, constipation, diarrhea, heartburn, nausea and vomiting.  ?Skin: Negative.  Negative for itching and rash.  ?Neurological:  Negative for dizziness and headaches.  ?Endo/Heme/Allergies:  Negative for environmental allergies. Does not bruise/bleed easily.   ? ? ? ?Objective:  ? ?Blood pressure 136/88, pulse (!) 109, temperature 98.1 ?F (36.7 ?C), resp. rate 18, height 5\' 10"  (1.778 m), weight 197 lb 2 oz (89.4 kg), SpO2 97 %. ?Body mass index is 28.28 kg/m?. ? ? ? ? ?Physical Exam ?Vitals reviewed.  ?Constitutional:   ?   General: He is awake.  ?   Appearance: He is well-developed.  ?   Comments: Very pleasant male.   ?HENT:  ?   Head: Normocephalic and  atraumatic.  ?   Right Ear: Tympanic membrane, ear canal and external ear normal. No drainage, swelling or tenderness. Tympanic membrane is not injected, scarred, erythematous, retracted or bulging.  ?   Left Ear: Tympanic mem

## 2022-04-08 NOTE — Patient Instructions (Addendum)
1. Moderate persistent asthma, uncomplicated ?- Lung testing was in the 70% range, but this improved with the albuterol puffs. ?- This points towards a diagnosis of asthma, but does not necessarily rule this in.  ?- We are starting you on a medication that contains THREE medications to help with breathing (an inhaled steroid to help with inflammation in the lungs, a long acting form of albuterol, and a third medication to help your muscles in your lungs loosen up).  ?- Daily controller medication(s): Trelegy 200/62.5/25 one puff once daily ?- Prior to physical activity: albuterol 2 puffs 10-15 minutes before physical activity. ?- Rescue medications: albuterol 4 puffs every 4-6 hours as needed ?- Asthma control goals:  ?* Full participation in all desired activities (may need albuterol before activity) ?* Albuterol use two time or less a week on average (not counting use with activity) ?* Cough interfering with sleep two time or less a month ?* Oral steroids no more than once a year ?* No hospitalizations ? ?2. Seasonal allergic rhinitis due to pollen ?- Continue with your antihistamine as needed.  ?- We are not going to test for allergies today. ?- We can always test in the future if we need to do so. ? ?3. GERD ?- If starting the new inhaler does not fully resolve your symptoms., I would strongly encourage you to take omeprazole EVERY day in case you are experiencing some silent reflux at night that is contributing to your breathing and wheezing problems. ? ?4. Return in about 2 months (around 06/08/2022).  ? ? ?Please inform us of any Emergency Department visits, hospitalizations, or changes in symptoms. Call us before going to the ED for breathing or allergy symptoms since we might be able to fit you in for a sick visit. Feel free to contact us anytime with any questions, problems, or concerns. ? ?It was a pleasure to meet you today! ? ?Websites that have reliable patient information: ?1. American Academy of  Asthma, Allergy, and Immunology: www.aaaai.org ?2. Food Allergy Research and Education (FARE): foodallergy.org ?3. Mothers of Asthmatics: http://www.asthmacommunitynetwork.org ?4. [employer] of Allergy, Asthma, and Immunology: MissingWeapons.ca ? ? ?COVID-19 Vaccine Information can be found at: PodExchange.nl For questions related to vaccine distribution or appointments, please email [email or web address] or call [phone].  ? ?We realize that you might be concerned about having an allergic reaction to the COVID19 vaccines. To help with that concern, WE ARE OFFERING THE COVID19 VACCINES IN OUR OFFICE! Ask the front desk for dates!  ? ? ? ??Like? [place] on Facebook and Instagram for our latest updates!  ?  ? ? ?A healthy democracy works best when [employer] participate! Make sure you are registered to vote! If you have moved or changed any of your contact information, you will need to get this updated before voting! ? ?In some cases, you MAY be able to register to vote online: [email or web address] ? ? ? ? ? ? ? ? ? ? ?

## 2022-04-21 ENCOUNTER — Telehealth: Payer: Self-pay | Admitting: Allergy & Immunology

## 2022-04-21 MED ORDER — NYSTATIN 100000 UNIT/ML MT SUSP: OROMUCOSAL | 0 refills | Status: DC

## 2022-04-21 NOTE — Telephone Encounter (Signed)
Pt states he did not rinse well after using breztri and believes he now has thrush.  ? ?Patient is requesting something to be sent in for him.  ? ?[facility] [place] [address] ? ?Best contact number: [phone] ?

## 2022-04-21 NOTE — Telephone Encounter (Signed)
Please send in a prescription for nystatin swish and spit  (100,000 units per mL) take 5 mL 4 times a day for 7 days and then stop. Retain in mouth as long as possible. Total 140 mL with no refills

## 2022-04-21 NOTE — Telephone Encounter (Signed)
Called and informed Nathan Wu of the Nystatin put into the pharmacy ? [place] [phone] ?

## 2022-05-04 ENCOUNTER — Other Ambulatory Visit: Payer: Self-pay | Admitting: Allergy & Immunology

## 2022-05-05 ENCOUNTER — Telehealth: Payer: Self-pay | Admitting: Allergy & Immunology

## 2022-05-05 NOTE — Telephone Encounter (Signed)
Nathan Wu called this morning and stated that the oral medication prescribed to him for thrush is not working after 7 days of use. Patient requests new prescription for diflucan or another option that will work better. [facility]. Nathan Wu requests a call back at [phone] ?

## 2022-05-05 NOTE — Telephone Encounter (Signed)
Dr. Gallagher please advise.  

## 2022-05-06 MED ORDER — FLUCONAZOLE 150 MG PO TABS: 150.0000 mg | ORAL_TABLET | Freq: Every day | ORAL | 0 refills | Status: AC

## 2022-05-06 NOTE — Telephone Encounter (Signed)
Per DPR, left detail message for patient of Dr Ellouise Newer comments ? ?

## 2022-05-06 NOTE — Telephone Encounter (Signed)
I sent in fluconazole 150 mg daily for 2 weeks. ? ?Malachi Bonds, MD ?[facility] ? ?

## 2022-05-14 ENCOUNTER — Telehealth: Payer: Self-pay

## 2022-05-14 NOTE — Telephone Encounter (Signed)
Patient started Saturday with sore throat, drainage, chest congestion,low grade fever 3 negative covid tests appt tomorrow at 1230

## 2022-05-15 ENCOUNTER — Ambulatory Visit
Admission: RE | Admit: 2022-05-15 | Discharge: 2022-05-15 | Disposition: A | Discharge status: Home / Self Care | Payer: 59 | Source: Ambulatory Visit | Attending: Internal Medicine | Admitting: Internal Medicine

## 2022-05-15 ENCOUNTER — Ambulatory Visit (INDEPENDENT_AMBULATORY_CARE_PROVIDER_SITE_OTHER): Payer: 59 | Admitting: Internal Medicine

## 2022-05-15 ENCOUNTER — Encounter: Payer: Self-pay | Admitting: Internal Medicine

## 2022-05-15 VITALS — BP 114/80 | HR 84 | Temp 97.0°F

## 2022-05-15 DIAGNOSIS — E782 Mixed hyperlipidemia: Secondary | ICD-10-CM

## 2022-05-15 DIAGNOSIS — B001 Herpesviral vesicular dermatitis: Secondary | ICD-10-CM

## 2022-05-15 DIAGNOSIS — I10 Essential (primary) hypertension: Secondary | ICD-10-CM

## 2022-05-15 DIAGNOSIS — R062 Wheezing: Secondary | ICD-10-CM

## 2022-05-15 DIAGNOSIS — J22 Unspecified acute lower respiratory infection: Secondary | ICD-10-CM

## 2022-05-15 DIAGNOSIS — J3089 Other allergic rhinitis: Secondary | ICD-10-CM

## 2022-05-15 MED ORDER — PENCICLOVIR 1 % EX CREA: 1.0000 "application " | TOPICAL_CREAM | CUTANEOUS | 1 refills | Status: DC

## 2022-05-15 MED ORDER — AZITHROMYCIN 250 MG PO TABS: ORAL_TABLET | ORAL | 0 refills | Status: AC

## 2022-05-15 MED ORDER — METHYLPREDNISOLONE ACETATE 80 MG/ML IJ SUSP
80.0000 mg | Freq: Once | INTRAMUSCULAR | Status: AC
  Administered 2022-05-15: 80 mg via INTRAMUSCULAR

## 2022-05-15 MED ORDER — HYDROCODONE BIT-HOMATROP MBR 5-1.5 MG/5ML PO SOLN: 5.0000 mL | Freq: Three times a day (TID) | ORAL | 0 refills | Status: DC | PRN

## 2022-05-15 NOTE — Progress Notes (Signed)
   Subjective:    Patient ID: Nathan Wu, male    DOB: 31-Mar-1966, 56 y.o.   MRN: [id number]  HPI 56 year old Male had onset last Saturday of sinus drainage and sore throat. Coughing up discolored sputum.  Says he has had thrush for about 4 weeks.  He took 3 COVID tests all of which was negative.  No fever.  He says he is going back to see Allergist soon.  [facility] and was diagnosed with moderate persistent asthma.  Allergist felt that GE reflux was contributing to nighttime wheezing.  Pulmonary function testing was in the 70% range.  Patient was started on Trelegy 1 puff daily.  Patient was advised to use albuterol before exercising and to use albuterol as a rescue medication if needed.  He was to take antihistamine as needed.  Omeprazole daily was recommended for reflux symptoms.  He has follow-up appointment mid June.  Review of Systems has developed coated tongue.     Objective:   Physical Exam He is afebrile.  Pulse oximetry 99% on room air pulse is 84 and regular and blood pressure 114/80. Skin warm and dry.  No cervical adenopathy.  Pharynx is without exudate.  TMs are clear.  Neck is supple.  Chest remarkable for bilateral expiratory wheezing      Assessment & Plan:  Acute bronchospasm  Plan: Patient was given Depo-Medrol 80 mg IM.  Was given Zithromax Z-PAK 2 tabs day 1 followed by 1 tab days 2 through 5.  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  Follow-up with Dr. Dellis Anes in June.

## 2022-05-21 NOTE — Patient Instructions (Signed)
Depo-Medrol 80 mg IM.  Take Zithromax Z-PAK as directed.  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  Take Hycodan 1 teaspoon every 8 hours as needed for cough and follow-up with Dr. Ernst Bowler in June.

## 2022-05-30 ENCOUNTER — Other Ambulatory Visit: Payer: Self-pay | Admitting: Internal Medicine

## 2022-06-10 ENCOUNTER — Ambulatory Visit (INDEPENDENT_AMBULATORY_CARE_PROVIDER_SITE_OTHER): Payer: 59 | Admitting: Allergy & Immunology

## 2022-06-10 ENCOUNTER — Other Ambulatory Visit (INDEPENDENT_AMBULATORY_CARE_PROVIDER_SITE_OTHER): Payer: 59

## 2022-06-10 ENCOUNTER — Encounter: Payer: Self-pay | Admitting: Allergy & Immunology

## 2022-06-10 VITALS — BP 140/86 | HR 92 | Temp 97.8°F | Resp 18 | Ht 70.0 in | Wt 189.5 lb

## 2022-06-10 DIAGNOSIS — B370 Candidal stomatitis: Secondary | ICD-10-CM

## 2022-06-10 DIAGNOSIS — K219 Gastro-esophageal reflux disease without esophagitis: Secondary | ICD-10-CM | POA: Diagnosis not present

## 2022-06-10 DIAGNOSIS — I10 Essential (primary) hypertension: Secondary | ICD-10-CM

## 2022-06-10 DIAGNOSIS — J4540 Moderate persistent asthma, uncomplicated: Secondary | ICD-10-CM

## 2022-06-10 DIAGNOSIS — J301 Allergic rhinitis due to pollen: Secondary | ICD-10-CM

## 2022-06-10 DIAGNOSIS — Z125 Encounter for screening for malignant neoplasm of prostate: Secondary | ICD-10-CM

## 2022-06-10 DIAGNOSIS — E782 Mixed hyperlipidemia: Secondary | ICD-10-CM

## 2022-06-10 MED ORDER — NYSTATIN 100000 UNIT/ML MT SUSP: 5.0000 mL | Freq: Four times a day (QID) | OROMUCOSAL | 0 refills | Status: DC

## 2022-06-10 MED ORDER — SPIRIVA RESPIMAT 1.25 MCG/ACT IN AERS: 2.0000 | INHALATION_SPRAY | Freq: Every day | RESPIRATORY_TRACT | 5 refills | Status: DC

## 2022-06-10 NOTE — Patient Instructions (Addendum)
1. Moderate persistent asthma, uncomplicated - Lung testing looked stable today.   - We are going to put you on Spiriva two puffs once daily (this is NOT a steroid and should NOT cause thrush).  - Monitor use of albuterol with the Spiriva use.  - Daily controller medication(s): Spiriva 1.1mcg two puffs once daily - Prior to physical activity: albuterol 2 puffs 10-15 minutes before physical activity. - Rescue medications: albuterol 4 puffs every 4-6 hours as needed - Asthma control goals:  * Full participation in all desired activities (may need albuterol before activity) * Albuterol use two time or less a week on average (not counting use with activity) * Cough interfering with sleep two time or less a month * Oral steroids no more than once a year * No hospitalizations  2. Seasonal allergic rhinitis due to pollen - Continue with your antihistamine as needed.   3. GERD - Continue with omeprazole as needed.   4. Recent oral thrush - Start nystatin swish and spit four times daily for one week. - Hopefully we can get that dental work done.   5 Return in about 6 months (around 12/10/2022).    Please inform us of any Emergency Department visits, hospitalizations, or changes in symptoms. Call us before going to the ED for breathing or allergy symptoms since we might be able to fit you in for a sick visit. Feel free to contact us anytime with any questions, problems, or concerns.  It was a pleasure to see you again today!  Websites that have reliable patient information: 1. American Academy of Asthma, Allergy, and Immunology: www.aaaai.org 2. Food Allergy Research and Education (FARE): foodallergy.org 3. Mothers of Asthmatics: http://www.asthmacommunitynetwork.org 4. American College of Allergy, Asthma, and Immunology: www.acaai.org   COVID-19 Vaccine Information can be found at: PodExchange.nl For questions related to  vaccine distribution or appointments, please email [email or web address] or call [phone].   We realize that you might be concerned about having an allergic reaction to the COVID19 vaccines. To help with that concern, WE ARE OFFERING THE COVID19 VACCINES IN OUR OFFICE! Ask the front desk for dates!     "Like" [place] on Facebook and Instagram for our latest updates!      A healthy democracy works best when [employer] participate! Make sure you are registered to vote! If you have moved or changed any of your contact information, you will need to get this updated before voting!  In some cases, you MAY be able to register to vote online: [email or web address]

## 2022-06-10 NOTE — Progress Notes (Signed)
FOLLOW UP  Date of Service/Encounter:  06/10/22   Assessment:   Moderate persistent asthma, uncomplicated - with improvement with bronchodilator treatment   Seasonal allergic rhinitis due to pollen   GERD - on omeprazole on a PRN basis   Preference for not taking medications routinely  Recent episode of oral thrush - starting another nystatin course out of an abundance of cautions  Plan/Recommendations:   1. Moderate persistent asthma, uncomplicated - Lung testing looked stable today.   - We are going to put you on Spiriva two puffs once daily (this is NOT a steroid and should NOT cause thrush).  - Monitor use of albuterol with the Spiriva use.  - Daily controller medication(s): Spiriva 1.40mcg two puffs once daily - Prior to physical activity: albuterol 2 puffs 10-15 minutes before physical activity. - Rescue medications: albuterol 4 puffs every 4-6 hours as needed - Asthma control goals:  * Full participation in all desired activities (may need albuterol before activity) * Albuterol use two time or less a week on average (not counting use with activity) * Cough interfering with sleep two time or less a month * Oral steroids no more than once a year * No hospitalizations  2. Seasonal allergic rhinitis due to pollen - Continue with your antihistamine as needed.   3. GERD - Continue with omeprazole as needed.   4. Return in about 6 months (around 12/10/2022).     Subjective:   Nathan Wu is a 56 y.o. male presenting today for follow up of  Chief Complaint  Patient presents with   Follow-up    [name] has a history of the following: Patient Active Problem List   Diagnosis Date Noted   Moderate persistent asthma, uncomplicated 04/08/2022   Seasonal allergic rhinitis due to pollen 04/08/2022    History obtained from: chart review and patient.  Nathan Wu is a 56 y.o. male presenting for a follow up visit.  He was last seen in April 2023  as a new patient.  At that time, his lung testing was in the 70% range but improved with albuterol.  We started him on Trelegy 1 puff once daily if only because he did not like taking medications routinely.  We continued with albuterol as needed.  For his allergic rhinitis, we continued with his antihistamine as needed.  We did not do testing since his symptoms did not seem too severe.  He was coughing a lot at night and we hope that the Trelegy would help, but discussed the diagnosis of reflux as a contributor of his nighttime wheeze.  We recommended taking omeprazole every day to see if that would help at all.  Since last visit, he has largely done well.   Asthma/Respiratory Symptom History: He did Trelegy for one week and then he started having thrush.  He reports that his tongue and his mouth were white. He developed scalloping and this remained on his tongue for a month or so. He did nystatin for one week and then did some home remedies for a week. After six days or so, he started having improving symptoms and he thinks that it resolved. Last day of that was 2-3 weeks ago.   Around one month ago, he had a LRTI. He used albuterol 4 times daily per the PCP recommendation. He is no longer using this 4 times daily. He has now decreased to two times daily for a bit and now he is using it only as needed when  he feels "something".   His wheezing has improved since the last visit. He had a CXR and this was negative for PNA. He remains on the omeprazole, but he is not taking it consistently as per the last visit. He has not been using his allergy medications at all either.   Drainage has slowed down which has been helpful. His most important concern at this time is getting some dental work done. They apparently would not do anything with him having his oral thrush infection. He continues to note some sensation of film on his tongue.  Otherwise, there have been no changes to his past medical history, surgical  history, family history, or social history.    Review of Systems  Constitutional: Negative.  Negative for fever, malaise/fatigue and weight loss.  HENT: Negative.  Negative for congestion, ear discharge and ear pain.   Eyes:  Negative for pain, discharge and redness.  Respiratory:  Negative for cough, sputum production, shortness of breath and wheezing.   Cardiovascular: Negative.  Negative for chest pain and palpitations.  Gastrointestinal:  Negative for abdominal pain, constipation, diarrhea, heartburn, nausea and vomiting.  Skin: Negative.  Negative for itching and rash.  Neurological:  Negative for dizziness and headaches.  Endo/Heme/Allergies:  Negative for environmental allergies. Does not bruise/bleed easily.       Objective:   Blood pressure 140/86, pulse 92, temperature 97.8 F (36.6 C), resp. rate 18, height 5\' 10"  (1.778 m), weight 189 lb 8 oz (86 kg), SpO2 95 %. Body mass index is 27.19 kg/m.    Physical Exam Vitals reviewed.  Constitutional:      General: He is awake.     Appearance: He is well-developed.     Comments: Very pleasant male.   HENT:     Head: Normocephalic and atraumatic.     Right Ear: Tympanic membrane, ear canal and external ear normal. No drainage, swelling or tenderness. Tympanic membrane is not injected, scarred, erythematous, retracted or bulging.     Left Ear: Tympanic membrane, ear canal and external ear normal. No drainage, swelling or tenderness. Tympanic membrane is not injected, scarred, erythematous, retracted or bulging.     Nose: No nasal deformity, septal deviation, mucosal edema or rhinorrhea.     Right Turbinates: Enlarged, swollen and pale.     Left Turbinates: Enlarged, swollen and pale.     Right Sinus: No maxillary sinus tenderness or frontal sinus tenderness.     Left Sinus: No maxillary sinus tenderness or frontal sinus tenderness.     Mouth/Throat:     Lips: Pink.     Mouth: Mucous membranes are moist. Mucous membranes  are not pale.     Pharynx: Uvula midline.     Comments: Cobblestoning of the posterior oropharynx. Eyes:     General: Lids are normal. No allergic shiner.       Right eye: No discharge.        Left eye: No discharge.     Conjunctiva/sclera: Conjunctivae normal.     Right eye: Right conjunctiva is not injected. No chemosis.    Left eye: Left conjunctiva is not injected. No chemosis.    Pupils: Pupils are equal, round, and reactive to light.  Cardiovascular:     Rate and Rhythm: Normal rate and regular rhythm.     Heart sounds: Normal heart sounds.  Pulmonary:     Effort: Pulmonary effort is normal. No tachypnea, accessory muscle usage or respiratory distress.     Breath sounds: Normal breath  sounds. No wheezing, rhonchi or rales.     Comments: Wheezing initially heard especially in the superior segments of the bilateral lungs, left greater than right.  This cleared with administration of albuterol. Chest:     Chest wall: No tenderness.  Abdominal:     Tenderness: There is no abdominal tenderness. There is no guarding or rebound.  Lymphadenopathy:     Head:     Right side of head: No submandibular, tonsillar or occipital adenopathy.     Left side of head: No submandibular, tonsillar or occipital adenopathy.     Cervical: No cervical adenopathy.  Skin:    Coloration: Skin is not pale.     Findings: No abrasion, erythema, petechiae or rash. Rash is not papular, urticarial or vesicular.  Neurological:     Mental Status: He is alert.  Psychiatric:        Behavior: Behavior is cooperative.      Diagnostic studies:    Spirometry: results normal (FEV1: 3.15/83%, FVC: 3.78/77%, FEV1/FVC: 83%).    Spirometry consistent with possible restrictive disease.   Allergy Studies: none        Malachi Bonds, MD  [facility]

## 2022-06-11 LAB — CBC WITH DIFFERENTIAL/PLATELET
Absolute Monocytes: 464 cells/uL (ref 200–950)
Basophils Absolute: 17 cells/uL (ref 0–200)
Basophils Relative: 0.3 %
Eosinophils Absolute: 476 cells/uL (ref 15–500)
Eosinophils Relative: 8.2 %
HCT: 45.5 % (ref 38.5–50.0)
Hemoglobin: 14.9 g/dL (ref 13.2–17.1)
Lymphs Abs: 1543 cells/uL (ref 850–3900)
MCH: 27.2 pg (ref 27.0–33.0)
MCHC: 32.7 g/dL (ref 32.0–36.0)
MCV: 83.2 fL (ref 80.0–100.0)
MPV: 12.1 fL (ref 7.5–12.5)
Monocytes Relative: 8 %
Neutro Abs: 3300 cells/uL (ref 1500–7800)
Neutrophils Relative %: 56.9 %
Platelets: 160 10*3/uL (ref 140–400)
RBC: 5.47 10*6/uL (ref 4.20–5.80)
RDW: 13.3 % (ref 11.0–15.0)
Total Lymphocyte: 26.6 %
WBC: 5.8 10*3/uL (ref 3.8–10.8)

## 2022-06-11 LAB — COMPLETE METABOLIC PANEL WITH GFR
AG Ratio: 1.7 (calc) (ref 1.0–2.5)
ALT: 30 U/L (ref 9–46)
AST: 24 U/L (ref 10–35)
Albumin: 4.7 g/dL (ref 3.6–5.1)
Alkaline phosphatase (APISO): 65 U/L (ref 35–144)
BUN: 17 mg/dL (ref 7–25)
CO2: 26 mmol/L (ref 20–32)
Calcium: 9.7 mg/dL (ref 8.6–10.3)
Chloride: 98 mmol/L (ref 98–110)
Creat: 0.94 mg/dL (ref 0.70–1.30)
Globulin: 2.7 g/dL (calc) (ref 1.9–3.7)
Glucose, Bld: 89 mg/dL (ref 65–99)
Potassium: 4.7 mmol/L (ref 3.5–5.3)
Sodium: 133 mmol/L — ABNORMAL LOW (ref 135–146)
Total Bilirubin: 0.6 mg/dL (ref 0.2–1.2)
Total Protein: 7.4 g/dL (ref 6.1–8.1)
eGFR: 96 mL/min/{1.73_m2} (ref >=60)

## 2022-06-11 LAB — LIPID PANEL
Cholesterol: 202 mg/dL — ABNORMAL HIGH (ref <200)
HDL: 77 mg/dL (ref >=40)
LDL Cholesterol (Calc): 102 mg/dL (calc) — ABNORMAL HIGH
Non-HDL Cholesterol (Calc): 125 mg/dL (calc) (ref <130)
Total CHOL/HDL Ratio: 2.6 (calc) (ref <5.0)
Triglycerides: 124 mg/dL (ref <150)

## 2022-06-11 LAB — PSA: PSA: 0.64 ng/mL (ref <=4.00)

## 2022-06-12 ENCOUNTER — Encounter: Payer: Self-pay | Admitting: Internal Medicine

## 2022-06-12 ENCOUNTER — Ambulatory Visit (INDEPENDENT_AMBULATORY_CARE_PROVIDER_SITE_OTHER): Payer: 59 | Admitting: Internal Medicine

## 2022-06-12 VITALS — BP 128/88 | HR 86 | Temp 97.7°F | Ht 71.25 in | Wt 189.0 lb

## 2022-06-12 DIAGNOSIS — Z1211 Encounter for screening for malignant neoplasm of colon: Secondary | ICD-10-CM | POA: Diagnosis not present

## 2022-06-12 DIAGNOSIS — Z8659 Personal history of other mental and behavioral disorders: Secondary | ICD-10-CM

## 2022-06-12 DIAGNOSIS — Z6826 Body mass index (BMI) 26.0-26.9, adult: Secondary | ICD-10-CM

## 2022-06-12 DIAGNOSIS — Z0000 Encounter for general adult medical examination without abnormal findings: Secondary | ICD-10-CM

## 2022-06-12 DIAGNOSIS — J4540 Moderate persistent asthma, uncomplicated: Secondary | ICD-10-CM | POA: Diagnosis not present

## 2022-06-12 DIAGNOSIS — I10 Essential (primary) hypertension: Secondary | ICD-10-CM

## 2022-06-12 DIAGNOSIS — Z7185 Encounter for immunization safety counseling: Secondary | ICD-10-CM | POA: Diagnosis not present

## 2022-06-12 DIAGNOSIS — E782 Mixed hyperlipidemia: Secondary | ICD-10-CM

## 2022-06-12 DIAGNOSIS — F5102 Adjustment insomnia: Secondary | ICD-10-CM

## 2022-06-12 DIAGNOSIS — N529 Male erectile dysfunction, unspecified: Secondary | ICD-10-CM

## 2022-06-12 DIAGNOSIS — J301 Allergic rhinitis due to pollen: Secondary | ICD-10-CM

## 2022-06-12 DIAGNOSIS — Z23 Encounter for immunization: Secondary | ICD-10-CM

## 2022-06-12 LAB — POCT URINALYSIS DIPSTICK
Bilirubin, UA: NEGATIVE
Blood, UA: NEGATIVE
Glucose, UA: NEGATIVE
Ketones, UA: NEGATIVE
Leukocytes, UA: NEGATIVE
Nitrite, UA: NEGATIVE
Protein, UA: NEGATIVE
Spec Grav, UA: 1.015 (ref 1.010–1.025)
Urobilinogen, UA: 0.2 E.U./dL
pH, UA: 6.5 (ref 5.0–8.0)

## 2022-06-12 MED ORDER — TADALAFIL 10 MG PO TABS: 10.0000 mg | ORAL_TABLET | ORAL | 1 refills | Status: DC | PRN

## 2022-06-12 NOTE — Progress Notes (Signed)
   Subjective:    Patient ID: Nathan Wu, male    DOB: 1966-12-10, 56 y.o.   MRN: [id number]  HPI 56 year old Male seen for health maintenance exam and evaluation of medical issues.  Having some issues with erectile dysfunction and will try generic Cialis.  Colonoscopy referral made.  Past medical history: Fractured right wrist around 1977, hernia repair around 1975, history of shoulder surgery 2015.  Had COVID-19 May 21, 2021.  History of vitreous floater seen by ophthalmologist at [facility]mology in [facility], Dr. Sondra Barges.  History of plantar fasciitis and has seen podiatrist in the past.  Had lentigo removed from right neck and left rib in 2005.  Social history: He is married.  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  He is a former smoker.  Social alcohol consumption consisting of beer and mixed drinks.  No children.  Wife is retired.  Patient quit smoking 15 years ago.  He enjoys golf and fishing.  He was referred here by Donald Pore.  Family history: Father with history of hypertension and carotid endarterectomy.  Mother deceased with history of metastatic melanoma.  3 brothers-1 died at age 62 of undetermined causes.  1 brother age 75 in good health.  1 brother age 63 with hypertension.  Has been seen by allergist for chronic postnasal drip.  Had an episode of bronchospasm treated in late May with Depo-Medrol, Z-Pak and Hycodan.  He saw Dr. Dellis Anes in June and had lung testing that appeared to be stable.  Spiriva 2 puffs daily was prescribed .  Since it is not a steroid it is unlikely to cause any issues with thrush.  He is to take albuterol 15 minutes before physical activity and to use albuterol as a rescue medication if needed.  He was advised to continue antihistamine for seasonal allergic rhinitis due to pollen and continue with omeprazole for GE reflux.  He will be seen there again in December 2023.  Review of  Systems has stopped  steroid inhaler per Dr. Dellis Anes due to thrush  Coronary calcium score 5.33 January 2023  He is on Crestor 20 mg daily for history of mixed hyperlipidemia.  His lipids have improved considerably under very close to normal.  Total cholesterol is 202, HDL 77, triglycerides 124 and LDL cholesterol 102.  A year ago total cholesterol was 247, HDL 49, triglycerides 230 and LDL cholesterol 159.      Objective:   Physical Exam  Vital signs reviewed.  BMI 26.18 weight 189 pounds height 5 feet 11.25 inches pulse oximetry 97% blood pressure 128/88, pulse 86 and regular.  He is afebrile. Skin: Warm and dry.  No cervical adenopathy.  TMs clear.  Pharynx is clear.  Neck is supple.  No carotid bruits.  Chest clear.  Cardiac exam: Regular rate and rhythm without ectopy.  Abdomen is soft nondistended without hepatosplenomegaly masses or tenderness.  Prostate is normal without nodules.  No lower extremity pitting edema.  Brief neurological exam is intact without gross focal deficits.  Affect thought and judgment are normal.     Assessment & Plan:

## 2022-06-12 NOTE — Patient Instructions (Addendum)
Cialis prescribed for erectile dysfunction. Pneumococcal 20 given. Coronary calcium score is excellent. Colonoscopy referral placed.  Continue follow-up with Allergist.  Continue generic Zestril for hypertension.  Continue statin medication.  Continue Spiriva and Ventolin inhalers.  Return in 1 year or as needed.

## 2022-07-10 IMAGING — CR DG CHEST 2V
2 series · 2 of 2 positions shown · non-contrast
Comparison: Chest radiographs 01/03/2022.

CLINICAL DATA: Provided history: Wheezing and respiratory
congestion. Congestion, cough and wheezing for 3 days. History of
hypertension and asthma, former smoker.

EXAM:
CHEST - 2 VIEW

[w chest pa]
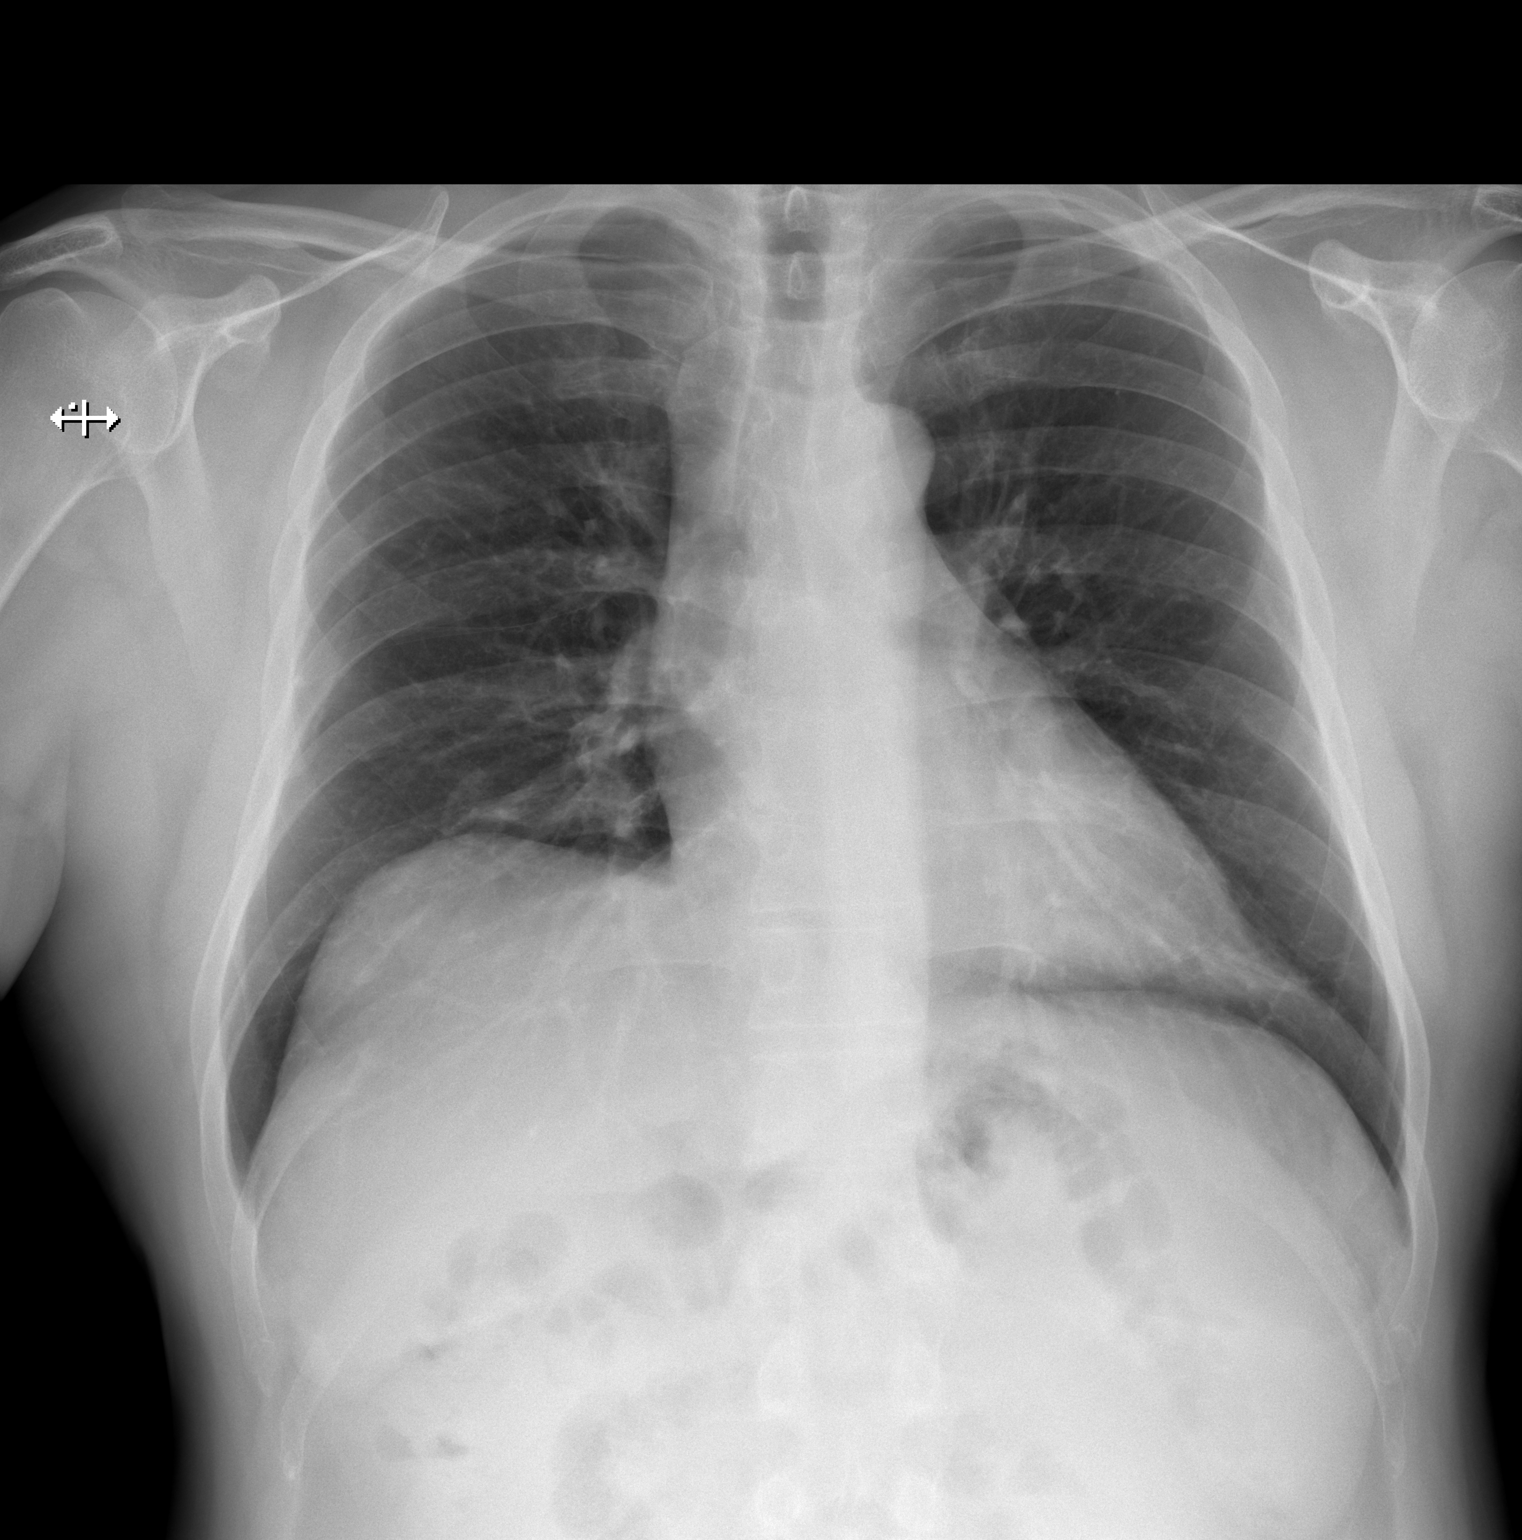

[w chest lat]
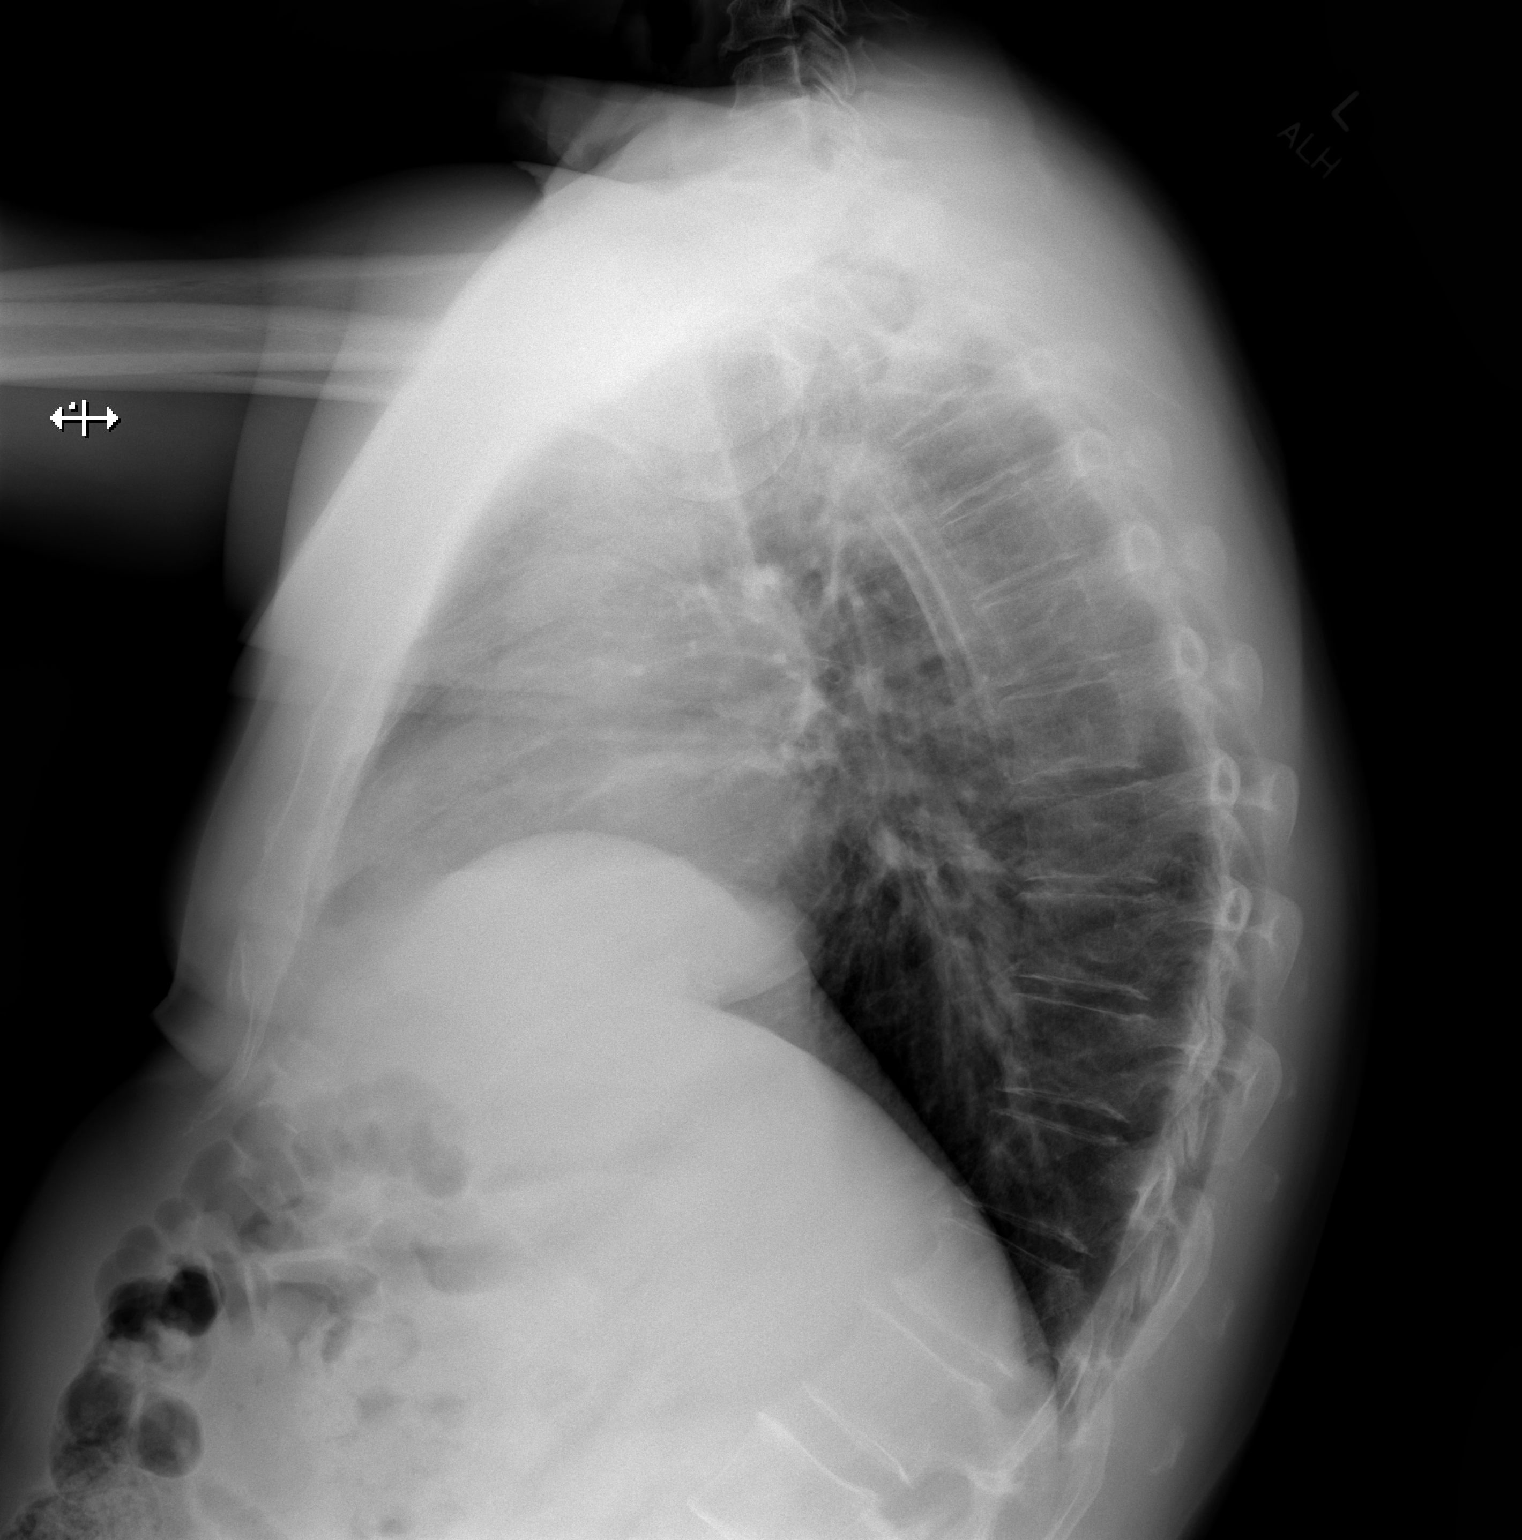

[2 of 2 positions shown; findings below may reference images not displayed]

FINDINGS: Heart size within normal limits. Mild chronic elevation of the right
hemidiaphragm. No appreciable airspace consolidation. No evidence of
pleural effusion or pneumothorax. No acute bony abnormality
identified.
IMPRESSION: No evidence of acute cardiopulmonary abnormality.

Mild chronic elevation of the right hemidiaphragm.

## 2022-07-11 ENCOUNTER — Encounter: Payer: Self-pay | Admitting: Gastroenterology

## 2022-07-28 ENCOUNTER — Ambulatory Visit (AMBULATORY_SURGERY_CENTER): Payer: Self-pay | Admitting: *Deleted

## 2022-07-28 VITALS — Ht 71.25 in | Wt 191.4 lb

## 2022-07-28 DIAGNOSIS — Z1211 Encounter for screening for malignant neoplasm of colon: Secondary | ICD-10-CM

## 2022-07-28 MED ORDER — NA SULFATE-K SULFATE-MG SULF 17.5-3.13-1.6 GM/177ML PO SOLN: 1.0000 | Freq: Once | ORAL | 0 refills | Status: AC

## 2022-07-28 NOTE — Progress Notes (Signed)
No egg or soy allergy known to patient  No issues known to pt with past sedation with any surgeries or procedures Patient denies ever being told they had issues or difficulty with intubation  No FH of Malignant Hyperthermia Pt is not on diet pills Pt is not on  home 02  Pt is not on blood thinners  Pt denies issues with constipation  No A fib or A flutter Have any cardiac testing pending--no Pt instructed to use Singlecare.com or GoodRx for a price reduction on prep   

## 2022-08-15 ENCOUNTER — Other Ambulatory Visit: Payer: Self-pay | Admitting: Allergy & Immunology

## 2022-08-15 ENCOUNTER — Other Ambulatory Visit: Payer: Self-pay | Admitting: Internal Medicine

## 2022-08-18 ENCOUNTER — Encounter: Payer: 59 | Admitting: Gastroenterology

## 2022-09-26 ENCOUNTER — Ambulatory Visit (AMBULATORY_SURGERY_CENTER): Payer: 59 | Admitting: Gastroenterology

## 2022-09-26 ENCOUNTER — Encounter: Payer: Self-pay | Admitting: Gastroenterology

## 2022-09-26 ENCOUNTER — Telehealth: Payer: Self-pay

## 2022-09-26 VITALS — BP 133/88 | HR 67 | Temp 98.0°F | Resp 15 | Ht 71.25 in | Wt 191.4 lb

## 2022-09-26 DIAGNOSIS — Z83719 Family history of colon polyps, unspecified: Secondary | ICD-10-CM | POA: Diagnosis not present

## 2022-09-26 DIAGNOSIS — Z1211 Encounter for screening for malignant neoplasm of colon: Secondary | ICD-10-CM

## 2022-09-26 MED ORDER — HYDROCORTISONE (PERIANAL) 2.5 % EX CREA: 1.0000 | TOPICAL_CREAM | Freq: Two times a day (BID) | CUTANEOUS | 0 refills | Status: DC

## 2022-09-26 MED ORDER — SODIUM CHLORIDE 0.9 % IV SOLN: 500.0000 mL | Freq: Once | INTRAVENOUS | Status: DC

## 2022-09-26 NOTE — Patient Instructions (Signed)
Handouts on hemorrhoids and diverticulosis given to you today  Start using Anusol cream twice a day.   YOU HAD AN ENDOSCOPIC PROCEDURE TODAY AT [facility]:   Refer to the procedure report that was given to you for any specific questions about what was found during the examination.  If the procedure report does not answer your questions, please call your gastroenterologist to clarify.  If you requested that your care partner not be given the details of your procedure findings, then the procedure report has been included in a sealed envelope for you to review at your convenience later.  YOU SHOULD EXPECT: Some feelings of bloating in the abdomen. Passage of more gas than usual.  Walking can help get rid of the air that was put into your GI tract during the procedure and reduce the bloating. If you had a lower endoscopy (such as a colonoscopy or flexible sigmoidoscopy) you may notice spotting of blood in your stool or on the toilet paper. If you underwent a bowel prep for your procedure, you may not have a normal bowel movement for a few days.  Please Note:  You might notice some irritation and congestion in your nose or some drainage.  This is from the oxygen used during your procedure.  There is no need for concern and it should clear up in a day or so.  SYMPTOMS TO REPORT IMMEDIATELY:  Following lower endoscopy (colonoscopy or flexible sigmoidoscopy):  Excessive amounts of blood in the stool  Significant tenderness or worsening of abdominal pains  Swelling of the abdomen that is new, acute  Fever of 100F or higher  For urgent or emergent issues, a gastroenterologist can be reached at any hour by calling [phone]. Do not use MyChart messaging for urgent concerns.    DIET:  We do recommend a small meal at first, but then you may proceed to your regular diet.  Drink plenty of fluids but you should avoid alcoholic beverages for 24 hours.  ACTIVITY:  You should plan to take  it easy for the rest of today and you should NOT DRIVE or use heavy machinery until tomorrow (because of the sedation medicines used during the test).    FOLLOW UP: Our staff will call the number listed on your records the next business day following your procedure.  We will call around 7:15- 8:00 am to check on you and address any questions or concerns that you may have regarding the information given to you following your procedure. If we do not reach you, we will leave a message.     Repeat colonoscopy recommended in 5 years.   SIGNATURES/CONFIDENTIALITY: You and/or your care partner have signed paperwork which will be entered into your electronic medical record.  These signatures attest to the fact that that the information above on your After Visit Summary has been reviewed and is understood.  Full responsibility of the confidentiality of this discharge information lies with you and/or your care-partner.

## 2022-09-26 NOTE — Progress Notes (Signed)
Referring Provider: Elby Showers, MD Primary Care Physician:  Elby Showers, MD   Indication for EGD:   Indication for Colonoscopy:  Colon cancer screening   IMPRESSION:   Need for colon cancer screening Appropriate candidate for monitored anesthesia care  PLAN: EGD Colonoscopy in the [place] today   HPI: Nathan Wu is a 56 y.o. male presents for screening colonoscopy.  No prior colonoscopy or colon cancer screening.  Brother with colon polyps. No other known family history of colon cancer or polyps. No family history of uterine/endometrial cancer, pancreatic cancer or gastric/stomach cancer.   Past Medical History:  Diagnosis Date   Allergy    seasonal   Anxiety    Arthritis    joints   Asthma    GERD (gastroesophageal reflux disease)    Hyperlipidemia    Hypertension     Past Surgical History:  Procedure Laterality Date   INGUINAL HERNIA REPAIR     as achild   MOUTH SURGERY     dental extraction   SHOULDER SURGERY Left    UPPER GASTROINTESTINAL ENDOSCOPY     wrist Right     Current Outpatient Medications  Medication Sig Dispense Refill   albuterol (VENTOLIN HFA) 108 (90 Base) MCG/ACT inhaler TAKE 2 PUFFS BY MOUTH EVERY 6 HOURS AS NEEDED FOR WHEEZE OR SHORTNESS OF BREATH 6.7 each 2   ALPRAZolam (XANAX) 0.5 MG tablet TAKE 1 TABLET BY MOUTH TWICE A DAY AS NEEDED FOR ANXIETY 60 tablet 2   lisinopril (ZESTRIL) 10 MG tablet TAKE 1 TABLET BY MOUTH EVERY DAY 90 tablet 1   rosuvastatin (CRESTOR) 20 MG tablet TAKE 1 TABLET BY MOUTH EVERY DAY 90 tablet 3   Tiotropium Bromide Monohydrate (SPIRIVA RESPIMAT) 1.25 MCG/ACT AERS Inhale 2 puffs into the lungs daily. 4 g 5   HYDROcodone bit-homatropine (HYCODAN) 5-1.5 MG/5ML syrup Take 5 mLs by mouth every 8 (eight) hours as needed for cough. (Patient not taking: Reported on 06/10/2022) 120 mL 0   nystatin (MYCOSTATIN) 100000 UNIT/ML suspension Take 5 mLs (500,000 Units total) by mouth 4 (four) times daily.  (Patient not taking: Reported on 07/28/2022) 473 mL 0   penciclovir (DENAVIR) 1 % cream Apply 1 application. topically every 2 (two) hours. (Patient not taking: Reported on 06/12/2022) 1.5 g 1   prednisoLONE (ORAPRED) 15 MG/5ML solution Take by mouth. (Patient not taking: Reported on 07/28/2022)     tadalafil (CIALIS) 10 MG tablet Take 1 tablet (10 mg total) by mouth every other day as needed for erectile dysfunction. (Patient not taking: Reported on 07/28/2022) 10 tablet 1   Current Facility-Administered Medications  Medication Dose Route Frequency Provider Last Rate Last Admin   0.9 %  sodium chloride infusion  500 mL Intravenous Once Thornton Park, MD        Allergies as of 09/26/2022   (No Known Allergies)    Family History  Problem Relation Age of Onset   Melanoma Mother    Heart attack Father    Prostate cancer Father    Hypertension Father    Diverticulitis Father    Hypertension Brother    Obesity Brother    Colon polyps Brother    Colon cancer Neg Hx    Crohn's disease Neg Hx    Stomach cancer Neg Hx    Rectal cancer Neg Hx    Ulcerative colitis Neg Hx      Physical Exam: General:   Alert,  well-nourished, pleasant and cooperative in NAD Head:  Normocephalic and atraumatic. Eyes:  Sclera clear, no icterus.   Conjunctiva pink. Mouth:  No deformity or lesions.   Neck:  Supple; no masses or thyromegaly. Lungs:  Clear throughout to auscultation.   No wheezes. Heart:  Regular rate and rhythm; no murmurs. Abdomen:  Soft, non-tender, nondistended, normal bowel sounds, no rebound or guarding.  Msk:  Symmetrical. No boney deformities LAD: No inguinal or umbilical LAD Extremities:  No clubbing or edema. Neurologic:  Alert and  oriented x4;  grossly nonfocal Skin:  No obvious rash or bruise. Psych:  Alert and cooperative. Normal mood and affect.     Studies/Results: No results found.    Cheng Dec L. Orvan Falconer, MD, MPH 09/26/2022, 11:48 AM

## 2022-09-26 NOTE — Telephone Encounter (Signed)
Referral faxed to [facility] 

## 2022-09-26 NOTE — Progress Notes (Signed)
Pt's states no medical or surgical changes since previsit or office visit. VS assessed by AS 

## 2022-09-26 NOTE — Op Note (Signed)
[place] Patient Name: Nathan Wu Doctor Procedure Date: 09/26/2022 11:40 AM MRN: [id number] Endoscopist: Thornton Park MD, MD Age: 56 Referring MD:  Date of Birth: 11-20-1966 Gender: Male Account #: [id number] Procedure:                Colonoscopy Indications:              Screening for colorectal malignant neoplasm, This                            is the patient's first colonoscopy                           Brother with colon polyps Medicines:                Monitored Anesthesia Care Procedure:                Pre-Anesthesia Assessment:                           - Prior to the procedure, a History and Physical                            was performed, and patient medications and                            allergies were reviewed. The patient's tolerance of                            previous anesthesia was also reviewed. The risks                            and benefits of the procedure and the sedation                            options and risks were discussed with the patient.                            All questions were answered, and informed consent                            was obtained. Prior Anticoagulants: The patient has                            taken no previous anticoagulant or antiplatelet                            agents. ASA Grade Assessment: II - A patient with                            mild systemic disease. After reviewing the risks                            and benefits, the patient was deemed in  satisfactory condition to undergo the procedure.                           After obtaining informed consent, the colonoscope                            was passed under direct vision. Throughout the                            procedure, the patient's blood pressure, pulse, and                            oxygen saturations were monitored continuously. The                            CF [id number] was introduced through  the anus                            and advanced to the 3 cm into the ileum. A secodn                            forward view of the right colon was performed. The                            colonoscopy was performed without difficulty. The                            patient tolerated the procedure well. The quality                            of the bowel preparation was good. The terminal                            ileum, ileocecal valve, appendiceal orifice, and                            rectum were photographed. Scope In: 11:50:37 AM Scope Out: 11:59:55 AM Scope Withdrawal Time: 0 hours 7 minutes 15 seconds  Total Procedure Duration: 0 hours 9 minutes 18 seconds  Findings:                 Prolapsed internal hemorrhoids were found on                            perianal exam.                           Multiple small and large-mouthed diverticula were                            found in the sigmoid colon and descending colon.                           The exam was otherwise without abnormality on  direct and retroflexion views except for internal                            hemorrhoids. Complications:            No immediate complications. Estimated Blood Loss:     Estimated blood loss: none. Impression:               - Hemorrhoids found on perianal exam.                           - Diverticulosis in the sigmoid colon and                            descending colon.                           - The examination was otherwise normal on direct                            and retroflexion views.                           - No specimens collected. Recommendation:           - Patient has a contact number available for                            emergencies. The signs and symptoms of potential                            delayed complications were discussed with the                            patient. Return to normal activities tomorrow.                             Written discharge instructions were provided to the                            patient.                           - High fiber diet.                           - Continue present medications.                           - Repeat colonoscopy in 5 years for surveillance,                            earlier with new symptoms.                           - Start using Anusol HC 2.5% applied sparingly to  your rectum twice daily.                           - If you would like more information,                            MyGIHealth.com and UpToDate.com have good                            information about hemorrhoids.                           - Emerging evidence supports eating a diet of                            fruits, vegetables, grains, calcium, and yogurt                            while reducing red meat and alcohol may reduce the                            risk of colon cancer.                           - Thank you for allowing me to be involved in your                            colon cancer prevention. Thornton Park MD, MD 09/26/2022 12:07:06 PM This report has been signed electronically.

## 2022-09-26 NOTE — Telephone Encounter (Signed)
-----   Message from Thornton Park, MD sent at 09/26/2022 12:02 PM EDT ----- Referral to general surgery for prolapsed internal hemorrhoids.  Thanks.  KLB

## 2022-09-26 NOTE — Progress Notes (Signed)
PT taken to PACU. Monitors in place. VSS. Report given to RN. 

## 2022-09-29 ENCOUNTER — Telehealth: Payer: Self-pay

## 2022-09-29 NOTE — Telephone Encounter (Signed)
No answer, left a message to call if having any issues or concerns, B.Zyquan Crotty RN. °

## 2022-10-14 ENCOUNTER — Other Ambulatory Visit: Payer: Self-pay | Admitting: Internal Medicine

## 2022-11-17 ENCOUNTER — Other Ambulatory Visit: Payer: Self-pay | Admitting: Gastroenterology

## 2022-11-20 ENCOUNTER — Other Ambulatory Visit: Payer: Self-pay | Admitting: Allergy & Immunology

## 2022-11-28 ENCOUNTER — Ambulatory Visit (INDEPENDENT_AMBULATORY_CARE_PROVIDER_SITE_OTHER): Payer: 59 | Admitting: Internal Medicine

## 2022-11-28 ENCOUNTER — Encounter: Payer: Self-pay | Admitting: Internal Medicine

## 2022-11-28 ENCOUNTER — Telehealth: Payer: Self-pay | Admitting: Internal Medicine

## 2022-11-28 ENCOUNTER — Ambulatory Visit
Admission: RE | Admit: 2022-11-28 | Discharge: 2022-11-28 | Disposition: A | Discharge status: Home / Self Care | Payer: 59 | Source: Ambulatory Visit | Attending: Internal Medicine | Admitting: Internal Medicine

## 2022-11-28 VITALS — BP 134/86 | HR 104 | Temp 99.1°F | Ht 71.25 in | Wt 190.0 lb

## 2022-11-28 DIAGNOSIS — M7989 Other specified soft tissue disorders: Secondary | ICD-10-CM

## 2022-11-28 DIAGNOSIS — M109 Gout, unspecified: Secondary | ICD-10-CM

## 2022-11-28 MED ORDER — INDOMETHACIN 50 MG PO CAPS: 50.0000 mg | ORAL_CAPSULE | Freq: Three times a day (TID) | ORAL | 0 refills | Status: DC

## 2022-11-28 NOTE — Telephone Encounter (Signed)
Nathan Wu [phone]  [name] called to say his Right foot big toe swollen and red, hot to touch, I scheduled him to come in at 12:30.

## 2022-11-28 NOTE — Progress Notes (Signed)
   Subjective:    Patient ID: Nathan Wu, male    DOB: [birth year], 56 y.o.   MRN: [id number]  HPI 56 year old Male with pain and swelling right great toe. Pain in toe started acutely on November 13th . No injury reported but did eat some rich food around that time. Had  swelling IP joint of right toe. Pain is continuous. Tried soaking toe in Epsom salt water but not much relief. He is concerned about gout.  Past medical history: History of plantar fasciitis in the remote past.  History of anxiety treated with Xanax.  History of hyperlipidemia.  History of asthma seen by Dr. Dellis Anes.  History of seasonal allergic rhinitis.  Longstanding history of insomnia.  Had COVID-19 in August 2022.  Review of Systems see above main issue is swelling and pain right great toe     Objective:   Physical Exam Blood pressure 134/86, pulse 104 he is a bit anxious, temperature 99.1 degrees pulse oximetry 99%  Right great toe is pink but not fiery red.  There is no drainage around the nailbed or from the toe at all.  It is tender to touch.     Assessment & Plan:   Acute gouty arthritis right great toe  Plan: Serum uric acid drawn and result is normal at 4.1 CBC with differential shows normal white blood cell count 7700 and normal hemoglobin.  He will take Indocin 50 mg by mouth 3 times daily for 7 days and let me know if not improving.

## 2022-11-29 LAB — CBC WITH DIFFERENTIAL/PLATELET
Absolute Monocytes: 531 cells/uL (ref 200–950)
Basophils Absolute: 23 cells/uL (ref 0–200)
Basophils Relative: 0.3 %
Eosinophils Absolute: 300 cells/uL (ref 15–500)
Eosinophils Relative: 3.9 %
HCT: 42.3 % (ref 38.5–50.0)
Hemoglobin: 14.1 g/dL (ref 13.2–17.1)
Lymphs Abs: 1278 cells/uL (ref 850–3900)
MCH: 27.5 pg (ref 27.0–33.0)
MCHC: 33.3 g/dL (ref 32.0–36.0)
MCV: 82.6 fL (ref 80.0–100.0)
MPV: 11.7 fL (ref 7.5–12.5)
Monocytes Relative: 6.9 %
Neutro Abs: 5567 cells/uL (ref 1500–7800)
Neutrophils Relative %: 72.3 %
Platelets: 180 10*3/uL (ref 140–400)
RBC: 5.12 10*6/uL (ref 4.20–5.80)
RDW: 12.7 % (ref 11.0–15.0)
Total Lymphocyte: 16.6 %
WBC: 7.7 10*3/uL (ref 3.8–10.8)

## 2022-11-29 LAB — URIC ACID: Uric Acid, Serum: 4.1 mg/dL (ref 4.0–8.0)

## 2022-12-11 ENCOUNTER — Ambulatory Visit: Payer: 59 | Admitting: Allergy & Immunology

## 2022-12-30 ENCOUNTER — Ambulatory Visit: Payer: 59 | Admitting: Allergy & Immunology

## 2023-01-02 ENCOUNTER — Other Ambulatory Visit: Payer: Self-pay | Admitting: Allergy & Immunology

## 2023-01-06 ENCOUNTER — Ambulatory Visit (INDEPENDENT_AMBULATORY_CARE_PROVIDER_SITE_OTHER): Payer: 59 | Admitting: Allergy & Immunology

## 2023-01-06 ENCOUNTER — Encounter: Payer: Self-pay | Admitting: Allergy & Immunology

## 2023-01-06 ENCOUNTER — Other Ambulatory Visit: Payer: Self-pay

## 2023-01-06 VITALS — BP 140/100 | HR 97 | Temp 98.2°F | Resp 12 | Wt 192.9 lb

## 2023-01-06 DIAGNOSIS — J301 Allergic rhinitis due to pollen: Secondary | ICD-10-CM

## 2023-01-06 DIAGNOSIS — K219 Gastro-esophageal reflux disease without esophagitis: Secondary | ICD-10-CM

## 2023-01-06 DIAGNOSIS — J4540 Moderate persistent asthma, uncomplicated: Secondary | ICD-10-CM

## 2023-01-06 NOTE — Patient Instructions (Addendum)
1. Moderate persistent asthma, uncomplicated - Lung testing looked excellent today (even better than last time).  - It seems that the Spiriva is working well to control your symptoms.  - There is a new rescue medication called AirSupra on the market now, but this contains an inhaled steroid which you do not tolerate very well.  - We are not going to make any medication changes at this time.  - Daily controller medication(s): Spiriva 1.37mcg two puffs once daily - Prior to physical activity: albuterol 2 puffs 10-15 minutes before physical activity. - Rescue medications: albuterol 4 puffs every 4-6 hours as needed - Asthma control goals:  * Full participation in all desired activities (may need albuterol before activity) * Albuterol use two time or less a week on average (not counting use with activity) * Cough interfering with sleep two time or less a month * Oral steroids no more than once a year * No hospitalizations  2. Seasonal allergic rhinitis due to pollen - Continue with your antihistamine as needed.   3. GERD - Continue with omeprazole as needed.   4. Return in about 6 months (around 07/07/2023).    Please inform us of any Emergency Department visits, hospitalizations, or changes in symptoms. Call us before going to the ED for breathing or allergy symptoms since we might be able to fit you in for a sick visit. Feel free to contact us anytime with any questions, problems, or concerns.  It was a pleasure to see you again today!  Websites that have reliable patient information: 1. American Academy of Asthma, Allergy, and Immunology: www.aaaai.org 2. Food Allergy Research and Education (FARE): foodallergy.org 3. Mothers of Asthmatics: http://www.asthmacommunitynetwork.org 4. American College of Allergy, Asthma, and Immunology: www.acaai.org   COVID-19 Vaccine Information can be found at: ShippingScam.co.uk For questions  related to vaccine distribution or appointments, please email vaccine@[place] .com or call [phone].   We realize that you might be concerned about having an allergic reaction to the COVID19 vaccines. To help with that concern, WE ARE OFFERING THE COVID19 VACCINES IN OUR OFFICE! Ask the front desk for dates!     "Like" [place] on Facebook and Instagram for our latest updates!      A healthy democracy works best when [employer] participate! Make sure you are registered to vote! If you have moved or changed any of your contact information, you will need to get this updated before voting!  In some cases, you MAY be able to register to vote online: [email or web address]

## 2023-01-06 NOTE — Progress Notes (Signed)
FOLLOW UP  Date of Service/Encounter:  01/06/23   Assessment:   Moderate persistent asthma, uncomplicated - with improvement with bronchodilator treatment   Seasonal allergic rhinitis due to pollen   GERD - on omeprazole on a PRN basis   Preference for not taking medications routinely   Recent episode of oral thrush - starting another nystatin course out of an abundance of cautions  Plan/Recommendations:   1. Moderate persistent asthma, uncomplicated - Lung testing looked excellent today (even better than last time).  - It seems that the Spiriva is working well to control your symptoms.  - There is a new rescue medication called AirSupra on the market now, but this contains an inhaled steroid which you do not tolerate very well.  - We are not going to make any medication changes at this time.  - Daily controller medication(s): Spiriva 1.45mcg two puffs once daily - Prior to physical activity: albuterol 2 puffs 10-15 minutes before physical activity. - Rescue medications: albuterol 4 puffs every 4-6 hours as needed - Asthma control goals:  * Full participation in all desired activities (may need albuterol before activity) * Albuterol use two time or less a week on average (not counting use with activity) * Cough interfering with sleep two time or less a month * Oral steroids no more than once a year * No hospitalizations  2. Seasonal allergic rhinitis due to pollen - Continue with your antihistamine as needed.   3. GERD - Continue with omeprazole as needed.   4. Return in about 6 months (around 07/07/2023).    Subjective:   Nathan Wu is a 57 y.o. male presenting today for follow up of  Chief Complaint  Patient presents with   Follow-up    No refills needed at this time.    Nathan Wu has a history of the following: Patient Active Problem List   Diagnosis Date Noted   Moderate persistent asthma, uncomplicated 15/40/0867   Seasonal  allergic rhinitis due to pollen 04/08/2022    History obtained from: chart review and patient.  Nathan Wu is a 57 y.o. male presenting for a follow up visit.  He was last seen in June 2023.  At that time, his lung testing looks stable.  We added on Spiriva 1.25 mcg 2 puffs once daily.  He has a history of getting thrush with any kind of inhaled steroid, so we avoided those.  Will continue with his antihistamine as needed for his allergic rhinitis.  We continue with omeprazole as needed for his GERD.  Since the last visit, he has done well.   Asthma/Respiratory Symptom History: He is using the Spiriva two puffs once daily. During the middle part of the year, he is not having nighttime wheezing. He did fine in the fall and then over the cold months, he tends to use his albuterol more often. Once he hit the cold air, he started having the tightness in his chest.  He has not had the thrush like he had with the other inhalers. The last time that he refilled his albuterol was sometime over the last couple of months. Stress can be a trigger as well. He has not been on prednisone and has not been to the ED for his symptoms. He has not been hospitalized since we saw him last.   Allergic Rhinitis Symptom History: He is on Flonase occasionally. He does not take it too often because he lost his smell and taste. He has not had nosebleeds  from this. He never had pouring out nosebleeds. He has not had sinus infections in 2023.   GERD Symptom History: He is on omeprazole and has been on that consistently for 3-4 weeks in total.   Otherwise, there have been no changes to his past medical history, surgical history, family history, or social history.    Review of Systems  Constitutional: Negative.  Negative for chills, fever, malaise/fatigue and weight loss.  HENT: Negative.  Negative for congestion, ear discharge, ear pain and sinus pain.   Eyes:  Negative for pain, discharge and redness.  Respiratory:  Negative  for cough, sputum production, shortness of breath, wheezing and stridor.   Cardiovascular: Negative.  Negative for chest pain and palpitations.  Gastrointestinal:  Negative for abdominal pain, constipation, diarrhea, heartburn, nausea and vomiting.  Skin: Negative.  Negative for itching and rash.  Neurological:  Negative for dizziness and headaches.  Endo/Heme/Allergies:  Positive for environmental allergies. Does not bruise/bleed easily.       Objective:   Blood pressure (!) 140/100, pulse 97, temperature 98.2 F (36.8 C), temperature source Temporal, resp. rate 12, weight 192 lb 14.4 oz (87.5 kg), SpO2 97 %. Body mass index is 26.72 kg/m.    Physical Exam Vitals reviewed.  Constitutional:      General: He is awake.     Appearance: He is well-developed.     Comments: Very pleasant male. Cooperative with the exam.   HENT:     Head: Normocephalic and atraumatic.     Right Ear: Tympanic membrane, ear canal and external ear normal. No drainage, swelling or tenderness. Tympanic membrane is not injected, scarred, erythematous, retracted or bulging.     Left Ear: Tympanic membrane, ear canal and external ear normal. No drainage, swelling or tenderness. Tympanic membrane is not injected, scarred, erythematous, retracted or bulging.     Nose: No nasal deformity, septal deviation, mucosal edema or rhinorrhea.     Right Turbinates: Enlarged, swollen and pale.     Left Turbinates: Enlarged, swollen and pale.     Right Sinus: No maxillary sinus tenderness or frontal sinus tenderness.     Left Sinus: No maxillary sinus tenderness or frontal sinus tenderness.     Comments: No nasal polyps noted.     Mouth/Throat:     Lips: Pink.     Mouth: Mucous membranes are moist. Mucous membranes are not pale.     Pharynx: Uvula midline.     Comments: Cobblestoning of the posterior oropharynx. Eyes:     General: Lids are normal. No allergic shiner.       Right eye: No discharge.        Left eye: No  discharge.     Conjunctiva/sclera: Conjunctivae normal.     Right eye: Right conjunctiva is not injected. No chemosis.    Left eye: Left conjunctiva is not injected. No chemosis.    Pupils: Pupils are equal, round, and reactive to light.  Cardiovascular:     Rate and Rhythm: Normal rate and regular rhythm.     Heart sounds: Normal heart sounds.  Pulmonary:     Effort: Pulmonary effort is normal. No tachypnea, accessory muscle usage or respiratory distress.     Breath sounds: Normal breath sounds. No wheezing, rhonchi or rales.     Comments: Wheezing initially heard especially in the superior segments of the bilateral lungs, left greater than right.  This cleared with administration of albuterol. Chest:     Chest wall: No tenderness.  Abdominal:  Tenderness: There is no abdominal tenderness. There is no guarding or rebound.  Lymphadenopathy:     Head:     Right side of head: No submandibular, tonsillar or occipital adenopathy.     Left side of head: No submandibular, tonsillar or occipital adenopathy.     Cervical: No cervical adenopathy.  Skin:    Coloration: Skin is not pale.     Findings: No abrasion, erythema, petechiae or rash. Rash is not papular, urticarial or vesicular.  Neurological:     Mental Status: He is alert.  Psychiatric:        Behavior: Behavior is cooperative.      Diagnostic studies:    Spirometry: results normal (FEV1: 3.34/89%, FVC: 3.98/81%, FEV1/FVC: 84%).    Spirometry consistent with normal pattern.   Allergy Studies: none        Salvatore Marvel, MD  [facility]

## 2023-01-12 ENCOUNTER — Other Ambulatory Visit: Payer: Self-pay | Admitting: Internal Medicine

## 2023-01-17 NOTE — Patient Instructions (Signed)
Serum uric acid is normal.  CBC with differential is normal.  He will take Indocin 50 mg by mouth 3 times daily for 7 days and let me know if symptoms or not improving.

## 2023-01-31 ENCOUNTER — Other Ambulatory Visit: Payer: Self-pay | Admitting: Allergy & Immunology

## 2023-02-25 ENCOUNTER — Ambulatory Visit (INDEPENDENT_AMBULATORY_CARE_PROVIDER_SITE_OTHER): Payer: 59 | Admitting: Orthopaedic Surgery

## 2023-02-25 ENCOUNTER — Ambulatory Visit (INDEPENDENT_AMBULATORY_CARE_PROVIDER_SITE_OTHER): Payer: 59

## 2023-02-25 DIAGNOSIS — M25562 Pain in left knee: Secondary | ICD-10-CM | POA: Diagnosis not present

## 2023-02-25 DIAGNOSIS — M7062 Trochanteric bursitis, left hip: Secondary | ICD-10-CM

## 2023-02-25 DIAGNOSIS — G8929 Other chronic pain: Secondary | ICD-10-CM

## 2023-02-25 MED ORDER — LIDOCAINE HCL 1 % IJ SOLN
3.0000 mL | INTRAMUSCULAR | Status: AC | PRN
  Administered 2023-02-25: 3 mL

## 2023-02-25 MED ORDER — METHYLPREDNISOLONE ACETATE 40 MG/ML IJ SUSP
40.0000 mg | INTRAMUSCULAR | Status: AC | PRN
  Administered 2023-02-25: 40 mg via INTRA_ARTICULAR

## 2023-02-25 NOTE — Progress Notes (Signed)
The patient is somewhat I am seeing for the first time.  He is a 57 year old gentleman has been having left knee pain but also left hip or sciatic pain for some time now.  He points to the medial aspect of his left knee as a source of pain.  He is never had any type of injection or injury.  He reports pain on the medial side with pivoting activities.  He does state he sits awkwardly at times and that may affect him.  He denies any also groin pain or any weakness going down his leg.  He denies any radicular symptoms.  He rarely takes anti-inflammatories.  He is a patient of Dr. Jossie Ng Baxley.  On exam he is a thin individual.  His left knee shows no effusion with full range of motion.  The knee is ligamentously stable.  His McMurray's and Lachman's exams are negative.  There is some medial joint line tenderness.  Examination of his left hip shows no pain in the groin at all.  When I have him lay on his side there is a little bit of pain over the trochanteric area but also some over the posterior pelvis area.  He has good strength and a negative straight leg raise.  2 views left knee show well-maintained joint space with no acute findings.  I did recommend a steroid injection in his left knee and over his left hip trochanteric area today which she agreed to and tolerated well.  I like to see him back in a month to see how he is doing overall.  He agrees with this treatment plan.  All questions and concerns were addressed and answered.    Procedure Note  Patient: Nathan Wu [name]             Date of Birth: [birth year]           MRN: [id number]             Visit Date: 02/25/2023  Procedures: Visit Diagnoses:  1. Chronic pain of left knee   2. Trochanteric bursitis, left hip     Large Joint Inj: L knee on 02/25/2023 2:42 PM Indications: diagnostic evaluation and pain Details: 22 G 1.5 in needle, superolateral approach  Arthrogram: No  Medications: 3 mL lidocaine 1 %; 40 mg  methylPREDNISolone acetate 40 MG/ML Outcome: tolerated well, no immediate complications Procedure, treatment alternatives, risks and benefits explained, specific risks discussed. Consent was given by the patient. Immediately prior to procedure a time out was called to verify the correct patient, procedure, equipment, support staff and site/side marked as required. Patient was prepped and draped in the usual sterile fashion.    Large Joint Inj: L greater trochanter on 02/25/2023 2:42 PM Indications: pain and diagnostic evaluation Details: 22 G 1.5 in needle, lateral approach  Arthrogram: No  Medications: 3 mL lidocaine 1 %; 40 mg methylPREDNISolone acetate 40 MG/ML Outcome: tolerated well, no immediate complications Procedure, treatment alternatives, risks and benefits explained, specific risks discussed. Consent was given by the patient. Immediately prior to procedure a time out was called to verify the correct patient, procedure, equipment, support staff and site/side marked as required. Patient was prepped and draped in the usual sterile fashion.

## 2023-03-12 ENCOUNTER — Other Ambulatory Visit: Payer: Self-pay | Admitting: Internal Medicine

## 2023-03-30 ENCOUNTER — Ambulatory Visit: Payer: 59 | Admitting: Orthopaedic Surgery

## 2023-03-30 ENCOUNTER — Other Ambulatory Visit: Payer: Self-pay | Admitting: Internal Medicine

## 2023-05-17 ENCOUNTER — Other Ambulatory Visit: Payer: Self-pay | Admitting: Internal Medicine

## 2023-05-27 ENCOUNTER — Ambulatory Visit: Payer: 59 | Admitting: Orthopaedic Surgery

## 2023-05-29 ENCOUNTER — Other Ambulatory Visit: Payer: Self-pay | Admitting: Internal Medicine

## 2023-06-02 ENCOUNTER — Other Ambulatory Visit: Payer: 59

## 2023-06-02 DIAGNOSIS — Z0000 Encounter for general adult medical examination without abnormal findings: Secondary | ICD-10-CM

## 2023-06-02 DIAGNOSIS — J4540 Moderate persistent asthma, uncomplicated: Secondary | ICD-10-CM

## 2023-06-02 DIAGNOSIS — Z13220 Encounter for screening for lipoid disorders: Secondary | ICD-10-CM

## 2023-06-02 DIAGNOSIS — Z125 Encounter for screening for malignant neoplasm of prostate: Secondary | ICD-10-CM

## 2023-06-02 LAB — CBC WITH DIFFERENTIAL/PLATELET
Neutrophils Relative %: 65.8 %
Platelets: 165 10*3/uL (ref 140–400)

## 2023-06-03 LAB — COMPLETE METABOLIC PANEL WITH GFR
AG Ratio: 2.4 (calc) (ref 1.0–2.5)
ALT: 31 U/L (ref 9–46)
AST: 25 U/L (ref 10–35)
Albumin: 4.8 g/dL (ref 3.6–5.1)
Alkaline phosphatase (APISO): 66 U/L (ref 35–144)
BUN: 12 mg/dL (ref 7–25)
CO2: 24 mmol/L (ref 20–32)
Calcium: 9.5 mg/dL (ref 8.6–10.3)
Chloride: 102 mmol/L (ref 98–110)
Creat: 0.97 mg/dL (ref 0.70–1.30)
Globulin: 2 g/dL (calc) (ref 1.9–3.7)
Glucose, Bld: 99 mg/dL (ref 65–99)
Potassium: 4.4 mmol/L (ref 3.5–5.3)
Sodium: 137 mmol/L (ref 135–146)
Total Bilirubin: 0.5 mg/dL (ref 0.2–1.2)
Total Protein: 6.8 g/dL (ref 6.1–8.1)
eGFR: 92 mL/min/{1.73_m2} (ref >=60)

## 2023-06-03 LAB — CBC WITH DIFFERENTIAL/PLATELET
Absolute Monocytes: 293 cells/uL (ref 200–950)
Basophils Absolute: 18 cells/uL (ref 0–200)
Basophils Relative: 0.4 %
Eosinophils Absolute: 140 cells/uL (ref 15–500)
Eosinophils Relative: 3.1 %
HCT: 43.5 % (ref 38.5–50.0)
Hemoglobin: 14.3 g/dL (ref 13.2–17.1)
Lymphs Abs: 1089 cells/uL (ref 850–3900)
MCH: 27.4 pg (ref 27.0–33.0)
MCHC: 32.9 g/dL (ref 32.0–36.0)
MCV: 83.5 fL (ref 80.0–100.0)
MPV: 11.7 fL (ref 7.5–12.5)
Monocytes Relative: 6.5 %
Neutro Abs: 2961 cells/uL (ref 1500–7800)
RBC: 5.21 10*6/uL (ref 4.20–5.80)
RDW: 12.2 % (ref 11.0–15.0)
Total Lymphocyte: 24.2 %
WBC: 4.5 10*3/uL (ref 3.8–10.8)

## 2023-06-03 LAB — LIPID PANEL
Cholesterol: 159 mg/dL (ref <200)
HDL: 71 mg/dL (ref >=40)
LDL Cholesterol (Calc): 68 mg/dL (calc)
Non-HDL Cholesterol (Calc): 88 mg/dL (calc) (ref <130)
Total CHOL/HDL Ratio: 2.2 (calc) (ref <5.0)
Triglycerides: 121 mg/dL (ref <150)

## 2023-06-03 LAB — PSA: PSA: 0.62 ng/mL (ref <=4.00)

## 2023-06-08 NOTE — Progress Notes (Shared)
Patient Care Team: Margaree Mackintosh, MD as PCP - General (Internal Medicine) Karie Soda, MD as Consulting Physician (General Surgery) Tressia Danas, MD as Consulting Physician (Gastroenterology)  Visit Date: 06/15/23  Subjective:    Patient ID: Nathan Wu , Male   DOB: Nov 14, 1966, 57 y.o.    MRN: [id number]   57 y.o. Male presents today for annual comprehensive physical exam.  History of anxiety treated with alprazolam 0.5 mg twice daily as needed. Has been having increased stress related to his job.  History of allergies and asthma treated with Ventolin, Spiriva inhalers.  History of hyperlipidemia treated with rosuvastatin 20 mg daily. Lipid panel normal. Believes this is causing muscle/joint pain. Has noticed improvement in pain after stopping medication.  History of hypertension treated with lisinopril 10 mg daily. Blood pressure normal today at 128/88.  Glucose normal. Kidney, liver functions normal. Electrolytes normal. Blood proteins normal. CBC normal. PSA at 0.62.  Colonoscopy last completed 09/26/22. Results showed hemorrhoids on perianal exam, diverticulosis in sigmoid and descending colon. Examination otherwise normal. Recommended repeat in 2028.  Past Medical History:  Diagnosis Date   Allergy    seasonal   Anxiety    Arthritis    joints   Asthma    GERD (gastroesophageal reflux disease)    Hyperlipidemia    Hypertension      Family History  Problem Relation Age of Onset   Melanoma Mother    Heart attack Father    Prostate cancer Father    Hypertension Father    Diverticulitis Father    Hypertension Brother    Obesity Brother    Colon polyps Brother    Colon cancer Neg Hx    Crohn's disease Neg Hx    Stomach cancer Neg Hx    Rectal cancer Neg Hx    Ulcerative colitis Neg Hx     Social History   Social History Narrative   Not on file      Review of Systems  Constitutional:  Negative for chills, fever, malaise/fatigue and  weight loss.  HENT:  Negative for hearing loss, sinus pain and sore throat.   Respiratory:  Negative for cough, hemoptysis and shortness of breath.   Cardiovascular:  Negative for chest pain, palpitations, leg swelling and PND.  Gastrointestinal:  Negative for abdominal pain, constipation, diarrhea, heartburn, nausea and vomiting.  Genitourinary:  Negative for dysuria, frequency and urgency.  Musculoskeletal:  Negative for back pain, myalgias and neck pain.  Skin:  Negative for itching and rash.  Neurological:  Negative for dizziness, tingling, seizures and headaches.  Endo/Heme/Allergies:  Negative for polydipsia.  Psychiatric/Behavioral:  Negative for depression. The patient is not nervous/anxious.         Objective:   Vitals: BP 128/88   Pulse 84   Temp 97.6 F (36.4 C) (Tympanic)   Resp 16   Ht 5\' 10"  (1.778 m)   Wt 183 lb 8 oz (83.2 kg)   SpO2 97%   BMI 26.33 kg/m    Physical Exam Vitals and nursing note reviewed.  Constitutional:      General: He is awake. He is not in acute distress.    Appearance: Normal appearance. He is not ill-appearing or toxic-appearing.  HENT:     Head: Normocephalic and atraumatic.     Right Ear: Tympanic membrane, ear canal and external ear normal.     Left Ear: Tympanic membrane, ear canal and external ear normal.     Mouth/Throat:  Pharynx: Oropharynx is clear.  Eyes:     Extraocular Movements: Extraocular movements intact.     Pupils: Pupils are equal, round, and reactive to light.  Neck:     Thyroid: No thyroid mass, thyromegaly or thyroid tenderness.     Vascular: No carotid bruit.  Cardiovascular:     Rate and Rhythm: Normal rate and regular rhythm. No extrasystoles are present.    Pulses:          Dorsalis pedis pulses are 1+ on the right side and 1+ on the left side.     Heart sounds: Normal heart sounds. No murmur heard.    No friction rub. No gallop.  Pulmonary:     Effort: Pulmonary effort is normal.     Breath  sounds: Normal breath sounds. No decreased breath sounds, wheezing, rhonchi or rales.  Chest:     Chest wall: No mass.  Abdominal:     Palpations: Abdomen is soft.     Tenderness: There is no abdominal tenderness.     Hernia: No hernia is present.  Genitourinary:    Prostate: Normal. Not enlarged and no nodules present.     Comments: Prostate smooth and symmetrical. Musculoskeletal:     Cervical back: Normal range of motion.     Right lower leg: No edema.     Left lower leg: No edema.  Lymphadenopathy:     Cervical: No cervical adenopathy.     Upper Body:     Right upper body: No supraclavicular adenopathy.     Left upper body: No supraclavicular adenopathy.  Skin:    General: Skin is warm and dry.  Neurological:     General: No focal deficit present.     Mental Status: He is alert and oriented to person, place, and time. Mental status is at baseline.     Cranial Nerves: Cranial nerves 2-12 are intact.     Sensory: Sensation is intact.     Motor: Motor function is intact.     Coordination: Coordination is intact.     Gait: Gait is intact.     Deep Tendon Reflexes: Reflexes are normal and symmetric.  Psychiatric:        Attention and Perception: Attention normal.        Mood and Affect: Mood normal.        Speech: Speech normal.        Behavior: Behavior normal. Behavior is cooperative.        Thought Content: Thought content normal.        Cognition and Memory: Cognition and memory normal.        Judgment: Judgment normal.       Results:   Studies obtained and personally reviewed by me:  Colonoscopy last completed 09/26/22. Results showed hemorrhoids on perianal exam, diverticulosis in sigmoid and descending colon. Examination otherwise normal. Recommended repeat in 2028.  Coronary calcium score 5.33 on 01/15/22.  Labs:       Component Value Date/Time   NA 137 06/02/2023 0925   K 4.4 06/02/2023 0925   CL 102 06/02/2023 0925   CO2 24 06/02/2023 0925   GLUCOSE 99  06/02/2023 0925   BUN 12 06/02/2023 0925   CREATININE 0.97 06/02/2023 0925   CALCIUM 9.5 06/02/2023 0925   PROT 6.8 06/02/2023 0925   AST 25 06/02/2023 0925   ALT 31 06/02/2023 0925   BILITOT 0.5 06/02/2023 0925   GFRNONAA 97 06/06/2021 1015   GFRAA 112 06/06/2021 1015  Lab Results  Component Value Date   WBC 4.5 06/02/2023   HGB 14.3 06/02/2023   HCT 43.5 06/02/2023   MCV 83.5 06/02/2023   PLT 165 06/02/2023    Lab Results  Component Value Date   CHOL 159 06/02/2023   HDL 71 06/02/2023   LDLCALC 68 06/02/2023   TRIG 121 06/02/2023   CHOLHDL 2.2 06/02/2023    No results found for: "HGBA1C"   No results found for: "TSH"   Lab Results  Component Value Date   PSA 0.62 06/02/2023   PSA 0.64 06/10/2022   PSA 0.68 06/06/2021    Assessment & Plan:   Anxiety: treated with alprazolam 0.5 mg twice daily as needed. Start bupropion 150 mg daily.  Allergies and asthma: stable with Ventolin, Spiriva inhalers. Has not needed Ventolin inhaler in several months.  Hyperlipidemia: can decrease to rosuvastatin 10 mg daily. Lipid panel normal. Coronary calcium score 5.33 on 01/15/22.  Hypertension: treated with lisinopril 10 mg daily. Blood pressure normal today at 128/88.  Prescribed generic Cialis (tadalafil) 2.5 mg as needed.  Urinalysis is normal.  Colonoscopy last completed 09/26/22. Results showed hemorrhoids on perianal exam, diverticulosis in sigmoid and descending colon. Examination otherwise normal. Recommended repeat in 2028.  Vaccine counseling: UTD on flu, tetanus, pneumococcal 20 vaccines.  Return in 1 year for health maintenance exam or as needed.    I,Alexander Ruley,acting as a Neurosurgeon for Margaree Mackintosh, MD.,have documented all relevant documentation on the behalf of Margaree Mackintosh, MD,as directed by  Margaree Mackintosh, MD while in the presence of Margaree Mackintosh, MD.   I, Margaree Mackintosh, MD, have reviewed all documentation for this visit. The documentation on  06/17/23 for the exam, diagnosis, procedures, and orders are all accurate and complete.

## 2023-06-11 ENCOUNTER — Other Ambulatory Visit: Payer: 59

## 2023-06-15 ENCOUNTER — Ambulatory Visit (INDEPENDENT_AMBULATORY_CARE_PROVIDER_SITE_OTHER): Payer: 59 | Admitting: Internal Medicine

## 2023-06-15 ENCOUNTER — Encounter: Payer: Self-pay | Admitting: Internal Medicine

## 2023-06-15 VITALS — BP 128/88 | HR 84 | Temp 97.6°F | Resp 16 | Ht 70.0 in | Wt 183.5 lb

## 2023-06-15 DIAGNOSIS — Z8659 Personal history of other mental and behavioral disorders: Secondary | ICD-10-CM

## 2023-06-15 DIAGNOSIS — I10 Essential (primary) hypertension: Secondary | ICD-10-CM | POA: Diagnosis not present

## 2023-06-15 DIAGNOSIS — F5102 Adjustment insomnia: Secondary | ICD-10-CM

## 2023-06-15 DIAGNOSIS — Z0000 Encounter for general adult medical examination without abnormal findings: Secondary | ICD-10-CM

## 2023-06-15 DIAGNOSIS — Z8739 Personal history of other diseases of the musculoskeletal system and connective tissue: Secondary | ICD-10-CM

## 2023-06-15 DIAGNOSIS — J301 Allergic rhinitis due to pollen: Secondary | ICD-10-CM

## 2023-06-15 DIAGNOSIS — E782 Mixed hyperlipidemia: Secondary | ICD-10-CM | POA: Diagnosis not present

## 2023-06-15 DIAGNOSIS — J3089 Other allergic rhinitis: Secondary | ICD-10-CM

## 2023-06-15 DIAGNOSIS — J4540 Moderate persistent asthma, uncomplicated: Secondary | ICD-10-CM

## 2023-06-15 DIAGNOSIS — N529 Male erectile dysfunction, unspecified: Secondary | ICD-10-CM

## 2023-06-15 LAB — POCT URINALYSIS DIPSTICK
Bilirubin, UA: NEGATIVE
Blood, UA: NEGATIVE
Glucose, UA: NEGATIVE
Ketones, UA: NEGATIVE
Leukocytes, UA: NEGATIVE
Protein, UA: NEGATIVE
Spec Grav, UA: 1.015 (ref 1.010–1.025)
Urobilinogen, UA: 0.2 E.U./dL
pH, UA: 6 (ref 5.0–8.0)

## 2023-06-15 MED ORDER — TADALAFIL 2.5 MG PO TABS: 2.5000 | ORAL_TABLET | Freq: Every day | ORAL | 0 refills | Status: DC

## 2023-06-15 MED ORDER — BUPROPION HCL ER (XL) 150 MG PO TB24
150.0000 mg | ORAL_TABLET | Freq: Every day | ORAL | 0 refills | Status: DC
Start: 2023-06-15 — End: 2023-09-12

## 2023-06-15 NOTE — Patient Instructions (Signed)
May try Co-enzyme Q purchased over-the-counter for myalgias.  If this does not help, we can try lower dose of cholesterol medication.  Your labs are stable.  Your blood pressure is stable.  Take Xanax sparingly for anxiety and start bupropion 150 mg daily for stress and anxiety.  Let us know if this is not working within a few weeks.  Cialis refilled.  Colonoscopy is up-to-date.  Up-to-date on vaccines at the present time.  Return in 1 year or as needed.  It was a pleasure to see you today.

## 2023-07-08 ENCOUNTER — Other Ambulatory Visit: Payer: Self-pay | Admitting: Internal Medicine

## 2023-07-14 ENCOUNTER — Other Ambulatory Visit: Payer: Self-pay | Admitting: Internal Medicine

## 2023-08-06 ENCOUNTER — Telehealth: Payer: Self-pay | Admitting: Allergy & Immunology

## 2023-08-06 MED ORDER — SPIRIVA RESPIMAT 1.25 MCG/ACT IN AERS: 2.0000 | INHALATION_SPRAY | Freq: Every day | RESPIRATORY_TRACT | 5 refills | Status: DC

## 2023-08-06 NOTE — Telephone Encounter (Signed)
I called patient as AVS says come back in 6 months (7/24). Patient said that after discussing with Dr.Gallagher at the appointment he mentioned that it would save him money if he came back for a follow up in 12 months. Patient stated that is what is written on his AVS as they scratched the 6 months out. Spiriva has been sent for patient to last until his yearly appt.

## 2023-08-06 NOTE — Telephone Encounter (Signed)
Patient called stating he needed a refill on the Spirivia to hold him over until his yearly follow up appointment.

## 2023-09-12 ENCOUNTER — Other Ambulatory Visit: Payer: Self-pay | Admitting: Internal Medicine

## 2023-11-12 ENCOUNTER — Other Ambulatory Visit: Payer: Self-pay | Admitting: Family

## 2023-11-12 DIAGNOSIS — I10 Essential (primary) hypertension: Secondary | ICD-10-CM

## 2023-12-11 ENCOUNTER — Other Ambulatory Visit: Payer: Self-pay

## 2023-12-11 MED ORDER — LISINOPRIL 10 MG PO TABS: 10.0000 mg | ORAL_TABLET | Freq: Every day | ORAL | 1 refills | Status: DC

## 2024-01-19 ENCOUNTER — Other Ambulatory Visit: Payer: Self-pay

## 2024-01-19 ENCOUNTER — Encounter: Payer: Self-pay | Admitting: Allergy & Immunology

## 2024-01-19 ENCOUNTER — Ambulatory Visit (INDEPENDENT_AMBULATORY_CARE_PROVIDER_SITE_OTHER): Payer: 59 | Admitting: Allergy & Immunology

## 2024-01-19 VITALS — BP 128/86 | HR 87 | Temp 98.2°F | Resp 16 | Ht 68.9 in | Wt 185.5 lb

## 2024-01-19 DIAGNOSIS — K219 Gastro-esophageal reflux disease without esophagitis: Secondary | ICD-10-CM

## 2024-01-19 DIAGNOSIS — J301 Allergic rhinitis due to pollen: Secondary | ICD-10-CM

## 2024-01-19 DIAGNOSIS — J4540 Moderate persistent asthma, uncomplicated: Secondary | ICD-10-CM

## 2024-01-19 NOTE — Patient Instructions (Addendum)
1. Moderate persistent asthma, uncomplicated - Lung testing looked excellent today. - Let's increase you Spiriva 2.5 mcg ONCE DAILY. - Daily controller medication(s): Spiriva 2.61mcg one puff once daily - Prior to physical activity: albuterol 2 puffs 10-15 minutes before physical activity. - Rescue medications: albuterol 4 puffs every 4-6 hours as needed - Asthma control goals:  * Full participation in all desired activities (may need albuterol before activity) * Albuterol use two time or less a week on average (not counting use with activity) * Cough interfering with sleep two time or less a month * Oral steroids no more than once a year * No hospitalizations  2. Seasonal allergic rhinitis due to pollen - Let's start Dymista two sprays per nostril twice daily. - You can start with one spray twice daily to see if that works.   3. GERD - Continue with omeprazole as needed.   4. Return in about 1 year (around 01/18/2025).    Please inform us of any Emergency Department visits, hospitalizations, or changes in symptoms. Call us before going to the ED for breathing or allergy symptoms since we might be able to fit you in for a sick visit. Feel free to contact us anytime with any questions, problems, or concerns.  It was a pleasure to see you again today!  Websites that have reliable patient information: 1. American Academy of Asthma, Allergy, and Immunology: www.aaaai.org 2. Food Allergy Research and Education (FARE): foodallergy.org 3. Mothers of Asthmatics: http://www.asthmacommunitynetwork.org 4. American College of Allergy, Asthma, and Immunology: www.acaai.org   COVID-19 Vaccine Information can be found at: [email or web address] For questions related to vaccine distribution or appointments, please email [email or web address] or call [phone].   We realize that you might be concerned about having an allergic reaction to the  COVID19 vaccines. To help with that concern, WE ARE OFFERING THE COVID19 VACCINES IN OUR OFFICE! Ask the front desk for dates!     "Like" [place] on Facebook and Instagram for our latest updates!      A healthy democracy works best when [employer] participate! Make sure you are registered to vote! If you have moved or changed any of your contact information, you will need to get this updated before voting!  In some cases, you MAY be able to register to vote online: [email or web address]

## 2024-01-19 NOTE — Progress Notes (Unsigned)
   FOLLOW UP  Date of Service/Encounter:  01/19/24   Assessment:   Moderate persistent asthma, uncomplicated - with improvement with bronchodilator treatment   Seasonal allergic rhinitis due to pollen (declined allergy testing)   GERD - on omeprazole on a PRN basis   Preference for not taking medications routinely   Recent episode of oral thrush - starting another nystatin course out of an abundance of cautions  Plan/Recommendations:   Assessment and Plan              There are no Patient Instructions on file for this visit.   Subjective:   Nathan Wu is a 58 y.o. male presenting today for follow up of  Chief Complaint  Patient presents with  . Asthma  . Allergies  . Gastroesophageal Reflux    Nathan Wu Heintzelman has a history of the following: Patient Active Problem List   Diagnosis Date Noted  . Moderate persistent asthma, uncomplicated 04/08/2022  . Seasonal allergic rhinitis due to pollen 04/08/2022    History obtained from: chart review and {Persons; PED relatives w/patient:19415::"patient"}.  Discussed the use of AI scribe software for clinical note transcription with the patient and/or guardian, who gave verbal consent to proceed.  Nathan Wu is a 58 y.o. male presenting for {Blank single:19197::"a food challenge","a drug challenge","skin testing","a sick visit","an evaluation of ***","a follow up visit"}.  He was last seen in January 2024.  At that time, lung testing looked excellent today.  He was doing very well with Spiriva 1.25 mcg 2 puffs daily as well as albuterol as needed.  For his allergic rhinitis, we continue with antihistamine as needed.  GERD was contorlled with omeprazole as needed.  We did treat him with another course of nystatin for thrush.  Discussed the use of AI scribe software for clinical note transcription with the patient, who gave verbal consent to proceed.  History of Present Illness            Asthma/Respiratory  Symptom History: ***  Allergic Rhinitis Symptom History: ***  Food Allergy Symptom History: ***  Skin Symptom History: ***  GERD Symptom History: ***  Infection Symptom History: ***  Otherwise, there have been no changes to his past medical history, surgical history, family history, or social history.    Review of systems otherwise negative other than that mentioned in the HPI.    Objective:   There were no vitals taken for this visit. There is no height or weight on file to calculate BMI.    Physical Exam   Diagnostic studies:    Spirometry: results normal (FEV1: 3.29/95%, FVC: 4.26/96%, FEV1/FVC: 77%).    Spirometry consistent with normal pattern. {Blank single:19197::"Albuterol/Atrovent nebulizer","Xopenex/Atrovent nebulizer","Albuterol nebulizer","Albuterol four puffs via MDI","Xopenex four puffs via MDI"} treatment given in clinic with {Blank single:19197::"significant improvement in FEV1 per ATS criteria","significant improvement in FVC per ATS criteria","significant improvement in FEV1 and FVC per ATS criteria","improvement in FEV1, but not significant per ATS criteria","improvement in FVC, but not significant per ATS criteria","improvement in FEV1 and FVC, but not significant per ATS criteria","no improvement"}.  Allergy Studies: {Blank single:19197::"none","deferred due to recent antihistamine use","deferred due to insurance stipulations that require a separate visit for testing","labs sent instead"," "}    {Blank single:19197::"Allergy testing results were read and interpreted by myself, documented by clinical staff."," "}      Malachi Bonds, MD  [facility]

## 2024-01-20 ENCOUNTER — Encounter: Payer: Self-pay | Admitting: Allergy & Immunology

## 2024-01-20 MED ORDER — AZELASTINE-FLUTICASONE 137-50 MCG/ACT NA SUSP: 2.0000 | Freq: Two times a day (BID) | NASAL | 5 refills | Status: AC

## 2024-02-02 ENCOUNTER — Other Ambulatory Visit: Payer: Self-pay | Admitting: Internal Medicine

## 2024-04-26 ENCOUNTER — Telehealth: Payer: Self-pay | Admitting: Allergy & Immunology

## 2024-04-26 ENCOUNTER — Other Ambulatory Visit: Payer: Self-pay | Admitting: Allergy & Immunology

## 2024-04-26 ENCOUNTER — Other Ambulatory Visit: Payer: Self-pay | Admitting: Internal Medicine

## 2024-04-26 MED ORDER — SPIRIVA RESPIMAT 1.25 MCG/ACT IN AERS
2.0000 | INHALATION_SPRAY | Freq: Every day | RESPIRATORY_TRACT | 3 refills | Status: AC
Start: 2024-04-26 — End: ?

## 2024-04-26 NOTE — Telephone Encounter (Signed)
 Nathan Wu came into the office and is requesting refills for Spiriva .  Nathan Wu states he wants to go back to the original done of 1.25 2 puffs into lungs daily.  Nathan Wu states this works better for him.  He would like this called in to [facility] in [place] on [address].

## 2024-04-26 NOTE — Telephone Encounter (Signed)
 I sent in the prescription.   Drexel Gentles, MD [facility] 

## 2024-05-07 ENCOUNTER — Other Ambulatory Visit: Payer: Self-pay | Admitting: Allergy & Immunology

## 2024-06-05 ENCOUNTER — Other Ambulatory Visit: Payer: Self-pay | Admitting: Internal Medicine

## 2024-08-14 ENCOUNTER — Other Ambulatory Visit: Payer: Self-pay | Admitting: Internal Medicine

## 2025-01-17 ENCOUNTER — Ambulatory Visit: Payer: 59 | Admitting: Allergy & Immunology
